# Patient Record
Sex: Male | Born: 1975 | Race: White | Hispanic: Yes | Marital: Single | Smoking: Current every day smoker
Health system: Southern US, Community
[De-identification: ages and names within clinical notes are randomized; demographics above are authoritative.]

## PROBLEM LIST (undated history)

## (undated) DIAGNOSIS — K760 Fatty (change of) liver, not elsewhere classified: Secondary | ICD-10-CM

## (undated) DIAGNOSIS — K859 Acute pancreatitis without necrosis or infection, unspecified: Secondary | ICD-10-CM

## (undated) HISTORY — DX: Fatty (change of) liver, not elsewhere classified: K76.0

## (undated) HISTORY — DX: Acute pancreatitis without necrosis or infection, unspecified: K85.90

---

## 1999-01-17 ENCOUNTER — Encounter: Payer: Self-pay | Admitting: Emergency Medicine

## 1999-01-17 ENCOUNTER — Emergency Department (HOSPITAL_COMMUNITY): Admission: EM | Admit: 1999-01-17 | Discharge: 1999-01-17 | Payer: Self-pay | Admitting: Emergency Medicine

## 1999-01-18 ENCOUNTER — Encounter: Payer: Self-pay | Admitting: Emergency Medicine

## 1999-01-24 ENCOUNTER — Emergency Department (HOSPITAL_COMMUNITY): Admission: EM | Admit: 1999-01-24 | Discharge: 1999-01-24 | Payer: Self-pay | Admitting: Emergency Medicine

## 2004-08-01 ENCOUNTER — Emergency Department (HOSPITAL_COMMUNITY): Admission: EM | Admit: 2004-08-01 | Discharge: 2004-08-01 | Payer: Self-pay | Admitting: Emergency Medicine

## 2007-11-24 ENCOUNTER — Emergency Department (HOSPITAL_COMMUNITY): Admission: EM | Admit: 2007-11-24 | Discharge: 2007-11-24 | Payer: Self-pay | Admitting: Emergency Medicine

## 2007-11-25 ENCOUNTER — Emergency Department (HOSPITAL_COMMUNITY): Admission: EM | Admit: 2007-11-25 | Discharge: 2007-11-25 | Payer: Self-pay | Admitting: Emergency Medicine

## 2010-06-24 ENCOUNTER — Inpatient Hospital Stay (HOSPITAL_COMMUNITY)
Admission: EM | Admit: 2010-06-24 | Discharge: 2010-06-29 | DRG: 440 | Disposition: A | Discharge status: Home / Self Care | Payer: Self-pay | Attending: Family Medicine | Admitting: Family Medicine

## 2010-06-24 ENCOUNTER — Emergency Department (HOSPITAL_COMMUNITY): Payer: Self-pay

## 2010-06-24 DIAGNOSIS — K859 Acute pancreatitis without necrosis or infection, unspecified: Secondary | ICD-10-CM

## 2010-06-24 DIAGNOSIS — E876 Hypokalemia: Secondary | ICD-10-CM | POA: Diagnosis present

## 2010-06-24 DIAGNOSIS — F1010 Alcohol abuse, uncomplicated: Secondary | ICD-10-CM | POA: Diagnosis present

## 2010-06-24 LAB — LACTATE DEHYDROGENASE: LDH: 181 U/L (ref 94–250)

## 2010-06-24 LAB — BASIC METABOLIC PANEL
GFR calc Af Amer: >60 mL/min (ref >60)
GFR calc non Af Amer: >60 mL/min (ref >60)
Potassium: 3.4 mEq/L — ABNORMAL LOW (ref 3.5–5.1)

## 2010-06-24 LAB — PROTIME-INR
INR: 0.97 (ref 0.00–1.49)
Prothrombin Time: 13.1 seconds (ref 11.6–15.2)

## 2010-06-24 LAB — CBC
Hemoglobin: 15.7 g/dL (ref 13.0–17.0)
MCHC: 35.4 g/dL (ref 30.0–36.0)
Platelets: 274 10*3/uL (ref 150–400)
Platelets: 242 10*3/uL (ref 150–400)

## 2010-06-24 LAB — COMPREHENSIVE METABOLIC PANEL
ALT: 73 U/L — ABNORMAL HIGH (ref 0–53)
Albumin: 4.3 g/dL (ref 3.5–5.2)
Alkaline Phosphatase: 67 U/L (ref 39–117)
Chloride: 99 mEq/L (ref 96–112)
Creatinine, Ser: 0.82 mg/dL (ref 0.4–1.5)
GFR calc non Af Amer: >60 mL/min (ref >60)
Glucose, Bld: 126 mg/dL — ABNORMAL HIGH (ref 70–99)
Potassium: 4 mEq/L (ref 3.5–5.1)
Total Bilirubin: 0.4 mg/dL (ref 0.3–1.2)
Total Protein: 8.3 g/dL (ref 6.0–8.3)

## 2010-06-24 LAB — DIFFERENTIAL
Eosinophils Absolute: 0 10*3/uL (ref 0.0–0.7)
Lymphs Abs: 1.3 10*3/uL (ref 0.7–4.0)
Monocytes Relative: 5 % (ref 3–12)

## 2010-06-24 MED ORDER — IOHEXOL 300 MG/ML  SOLN
80.0000 mL | Freq: Once | INTRAMUSCULAR | Status: AC | PRN
  Administered 2010-06-24: 80 mL via INTRAVENOUS

## 2010-06-25 LAB — COMPREHENSIVE METABOLIC PANEL
BUN: 4 mg/dL — ABNORMAL LOW (ref 6–23)
Calcium: 8.2 mg/dL — ABNORMAL LOW (ref 8.4–10.5)
Chloride: 97 mEq/L (ref 96–112)
Glucose, Bld: 97 mg/dL (ref 70–99)
Potassium: 3.1 mEq/L — ABNORMAL LOW (ref 3.5–5.1)
Total Bilirubin: 2.2 mg/dL — ABNORMAL HIGH (ref 0.3–1.2)
Total Protein: 7.9 g/dL (ref 6.0–8.3)

## 2010-06-25 LAB — CBC
HCT: 39.8 % (ref 39.0–52.0)
MCV: 91.7 fL (ref 78.0–100.0)
RBC: 4.34 MIL/uL (ref 4.22–5.81)
RDW: 12.5 % (ref 11.5–15.5)

## 2010-06-26 LAB — CBC
Hemoglobin: 12.4 g/dL — ABNORMAL LOW (ref 13.0–17.0)
MCH: 31.1 pg (ref 26.0–34.0)
MCV: 91.5 fL (ref 78.0–100.0)
RBC: 3.99 MIL/uL — ABNORMAL LOW (ref 4.22–5.81)
WBC: 16 10*3/uL — ABNORMAL HIGH (ref 4.0–10.5)

## 2010-06-26 LAB — GLUCOSE, CAPILLARY: Glucose-Capillary: 80 mg/dL (ref 70–99)

## 2010-06-26 LAB — COMPREHENSIVE METABOLIC PANEL
Alkaline Phosphatase: 57 U/L (ref 39–117)
CO2: 28 mEq/L (ref 19–32)
Chloride: 97 mEq/L (ref 96–112)
GFR calc Af Amer: >60 mL/min (ref >60)
GFR calc non Af Amer: >60 mL/min (ref >60)
Glucose, Bld: 73 mg/dL (ref 70–99)
Potassium: 3.3 mEq/L — ABNORMAL LOW (ref 3.5–5.1)
Total Protein: 6.9 g/dL (ref 6.0–8.3)

## 2010-06-27 LAB — COMPREHENSIVE METABOLIC PANEL
Alkaline Phosphatase: 60 U/L (ref 39–117)
BUN: 7 mg/dL (ref 6–23)
Calcium: 8.3 mg/dL — ABNORMAL LOW (ref 8.4–10.5)
Chloride: 96 mEq/L (ref 96–112)
Creatinine, Ser: 0.68 mg/dL (ref 0.4–1.5)
GFR calc Af Amer: >60 mL/min (ref >60)
Glucose, Bld: 69 mg/dL — ABNORMAL LOW (ref 70–99)
Total Protein: 6.9 g/dL (ref 6.0–8.3)

## 2010-06-27 LAB — CBC
MCH: 31.5 pg (ref 26.0–34.0)
MCV: 91.3 fL (ref 78.0–100.0)
Platelets: 232 10*3/uL (ref 150–400)
WBC: 12.8 10*3/uL — ABNORMAL HIGH (ref 4.0–10.5)

## 2010-06-28 LAB — COMPREHENSIVE METABOLIC PANEL
ALT: 55 U/L — ABNORMAL HIGH (ref 0–53)
AST: 92 U/L — ABNORMAL HIGH (ref 0–37)
Calcium: 8.3 mg/dL — ABNORMAL LOW (ref 8.4–10.5)
Creatinine, Ser: 0.62 mg/dL (ref 0.4–1.5)
Total Bilirubin: 0.7 mg/dL (ref 0.3–1.2)
Total Protein: 6.9 g/dL (ref 6.0–8.3)

## 2010-06-28 LAB — CBC
HCT: 34.7 % — ABNORMAL LOW (ref 39.0–52.0)
Hemoglobin: 12.1 g/dL — ABNORMAL LOW (ref 13.0–17.0)
MCH: 31.6 pg (ref 26.0–34.0)
MCHC: 34.9 g/dL (ref 30.0–36.0)
MCV: 90.6 fL (ref 78.0–100.0)
Platelets: 285 10*3/uL (ref 150–400)
RDW: 12.2 % (ref 11.5–15.5)

## 2010-06-29 LAB — BASIC METABOLIC PANEL
BUN: 9 mg/dL (ref 6–23)
CO2: 29 mEq/L (ref 19–32)
Chloride: 98 mEq/L (ref 96–112)
Glucose, Bld: 115 mg/dL — ABNORMAL HIGH (ref 70–99)
Potassium: 4.8 mEq/L (ref 3.5–5.1)
Sodium: 137 mEq/L (ref 135–145)

## 2010-06-29 NOTE — H&P (Signed)
[name], [name]           ACCOUNT NO.:  [id number]  MEDICAL RECORD NO.:  [id number]           PATIENT TYPE:  I  LOCATION:  2610                         FACILITY:  [facility]  PHYSICIAN:  Pearlean Brownie, M.D.DATE OF BIRTH:  04/19/1975  DATE OF ADMISSION:  06/24/2010 DATE OF DISCHARGE:                             HISTORY & PHYSICAL   PRIMARY CARE PROVIDER:  None.  This is an unassigned patient.  CHIEF COMPLAINT:  Abdominal pain.  HISTORY OF PRESENT ILLNESS:  [name] [name] is a 35 year old male with no past medical history, who presents with abdominal pain x1 day.  He woke up with this pain and it was very severe, constant, midepigastric being the worst, but generalized pain throughout, he calls it sharp and dull with  unrelenting pain.  Denies any type of fevers, chills, maybe some nausea.  Denies any vomiting, no diarrhea, no constipation.  He states he has had this one episode before while he was in [place], was given a shot and he was able to go home.  The patient denies any type of shortness of breath, chest pain, dysuria, polyuria, polydipsia either.  ALLERGIES:  No known drug allergies.  PAST MEDICAL HISTORY:  None.  PAST SURGICAL HISTORY:  None.  MEDICATIONS:  He takes Tylenol only as needed.  He has taken 2 yesterday.  SOCIAL HISTORY:  He lives alone, works as a Music therapist.  He does not smoke, but the patient does state that he drinks greater than 12 beers daily, more likely 15 if he was being honest, has not been without beer for sometime, he has never gone through withdrawals prior, and never had seizures.  No illicit drugs.  FAMILY HISTORY:  He has a brother with diabetes, otherwise unremarkable.  PERTINENT LABS:  The patient has a CBC done which shows a white blood cell count of 24.2, hemoglobin of 15.7, hematocrit of 44.4, and platelets of 274.  The patient has CMET done which shows a sodium of 138, potassium of 4.0, chloride of 99, bicarb of 22, BUN of  8, creatinine of 0.82, glucose of 126, AST of 51 and ALT of 73, total protein of 8.3, albumin of 4.3, and calcium of 9.2.  The patient also has a lipase of 368.  The patient has had an abdominal series done which shows mild diffuse gaseous distention which is likely a mild ileus and maybe some right lower lobe atelectasis.  The patient also had an abdominal ultrasound done that shows some fatty liver without any focal findings.  PHYSICAL EXAMINATION:  VITAL SIGNS:  The patient's temperature 97.9, pulse 115, respirations 22, blood pressure 152/89, and the patient is 99% on room air. GENERAL:  The patient is mildly distressed, very uncomfortable, is sweating. HEENT:  Pupils equal, round, reactive to light and accommodation.  The patient does have chalazion of the left eye. NECK:  No lymphadenopathy. HEART:  Tachy, regular rate and rhythm.  No murmur appreciated. LUNGS:  Bibasilar crackles, otherwise unremarkable.  Good air movement. No bowel sounds.  Mild-to-moderate distention, significant mostly in the epigastric region. EXTREMITIES:  No edema. NEUROVASCULAR:  Intact.  ASSESSMENT AND PLAN:  [name] [name] is  a 36 year old male admitted with of pancreatitis. 1. Pancreatitis.  The patient has an APACHE-II score of 5.0, his range     will be calculated later, he already has one point at this time     with the elevated white blood cell count.  We are worried with,     there is a chance this could be infectious versus necrotizing.  The     patient's past medical history is significant for alcohol which I     think this alcoholic pancreatitis as well.  Abdominal ultrasound     did not show any type of cholecystitis or any type of gallbladder     disease that could be causing this.  We will place the patient on     Primaxin.  We will do morphine as needed.  We will make the patient     n.p.o. and increase his IV fluids to 150 mL for sometime until the     patient looks more euvolemic and  is starting to feel a little     better.  We will monitor in the Step-Down Unit as of now.  We will     get a CT scan to rule out any type of necrotizing pancreatitis. 2. Alcohol abuse.  The patient appears to have a longstanding of that,     we will put him on CIWA protocol and monitor for any type of     withdrawal symptoms. 3. Fluids, electrolytes, nutrition and gastrointestinal, once again IV     fluids at 250.  The patient is n.p.o., we will not do an NG tube at     this time being that the patient appears to be comfortable.  If the     patient becomes in worse distress at that point we will do     decompression with an NG tube and also increase feeding. 4. Prophylaxis.  We will do SCDs, holding heparin now until I get the     patient's PT/INR, the patient's fatty liver to makes one concern     for the possibility of cirrhosis, especially with the patient's     drinking history and we will also do a PPI. 5. Disposition.  This will be pending further improvement.     [name] Primas, DO   ______________________________ Pearlean Brownie, M.D.    ZS/MEDQ  D:  06/24/2010  T:  06/25/2010  Job:  [id number]  Electronically Signed by [name] [name]  on 06/25/2010 11:47:33 AM Electronically Signed by Pearlean Brownie M.D. on 06/29/2010 03:46:01 PM

## 2010-07-12 NOTE — Discharge Summary (Signed)
[name] [name], [name] [name]           ACCOUNT NO.:  [id number]  MEDICAL RECORD NO.:  [id number]           PATIENT TYPE:  I  LOCATION:  3707                         FACILITY:  [facility]  PHYSICIAN:  [name] [name], M.D.DATE OF BIRTH:  Jul 30, 1975  DATE OF ADMISSION:  06/24/2010 DATE OF DISCHARGE:  06/29/2010                              DISCHARGE SUMMARY   PRIMARY CARE PROVIDER:  None.  DISCHARGE DIAGNOSES: 1. Alcoholic Pancreatitis, resolving. 2. Alcohol abuse. 3. Hypokalemia, resolved.  DISCHARGE MEDICATIONS:  None.  CONSULTS:  None.  PROCEDURES: 1. Abdominal ultrasound done on June 24, 2010, showing diffuse     hepatic steatosis without focal hepatic parenchymal abnormalities,     otherwise normal abdominal ultrasound with caveat that the     pancreatic head and tail in the mid distal abdominal aorta were     obscured by bowel gas, and therefore not evaluated. 2. CT pelvis on June 24, 2010, showing acute findings compatible with     acute pancreatitis involving the tail of the pancreas as well as     diffuse fatty infiltration of the liver and bibasilar atelectasis.  LABORATORY DATA:  On the day of admission, the patient's CBC showed a white count of 24.2 with 90% neutrophils and 5% lymphocytes.  On the day of discharge, white count had decreased to 10.0.  On day of admission, the patient's BMET was within normal limits with the exception of slightly elevated LFTs with AST 51 and ALT 73, creatinine was 0.82, lipase was elevated at 358, lipase decreased to 61 by the second day of admission and was no longer trended.  After rehydration, the patient's repeat BMET on the day of admission showed a potassium of 3.4, which decreased to 3.1 but on the day of discharge, has been improved to 4.8 in addition to the patient's magnesium was normal at 2.2.  BRIEF HOSPITAL COURSE:  This is a 35 year old male with history of alcohol abuse and pancreatitis, currently resolving. 1.  Pancreatitis.  Initial concern was for infection versus     inflammatory pancreatitis.  The patient initially was started on     Primaxin, which was stopped after 2 days and it was determined that     the pancreatitis was more likely to be due to inflammation from     alcohol abuse.  The patient was initially IV hydrated and made     n.p.o.  He was slowly weaned to clears, which he tolerated well and     on the day of discharge, he was tolerating a full diet.  Initially,     the patient was given morphine for pain control, however, this     worsened to pain likely secondary to sphincter of Oddi spasm.  He     was switched to IV Dilaudid, which controlled his pain.  Once he     was taking p.o.'s well, he was transitioned to [facility] and was     requiring minimal pain medications on the day of discharge. 2. Hypokalemia.  The patient was hyperkalemic on labs that were     checked.  He initially required IV potassium, which was changed  to     p.o. potassium once the patient was tolerating p.o.'s.  Magnesium    level was checked and was normal, however, IV magnesium was still     given x2 doses.  The patient's potassium had resolved well to 4.8     on the day of discharge. 3. Alcohol abuse.  On admission, the patient was started on CIWA     protocol.  The patient never had any Ativan requirement and never     showed any withdrawal symptoms.  The patient was amendable and     acknowledged the fact that he needed additional assistance with his     alcohol abuse problem and he agreed to inpatient rehab, which he     will be starting at [facility] on April 12.  DISCHARGE INSTRUCTIONS:  The patient was instructed to present to [facility] on April 12 in the morning to start his inpatient alcohol rehabilitation program.  DISCHARGE CONDITION:  The patient was discharged home in stable medical condition with the knowledge that he is to start inpatient alcohol rehab on July 08, 2010.    ______________________________ [name] Pore, MD   ______________________________ [name] Roach Grainger Mccarley, M.D.    JM/MEDQ  D:  06/29/2010  T:  06/30/2010  Job:  [id number]  Electronically Signed by [name] Pore MD on 07/05/2010 10:22:12 PM Electronically Signed by [name] [name] M.D. on 07/12/2010 09:34:12 AM

## 2010-11-12 ENCOUNTER — Emergency Department (HOSPITAL_COMMUNITY)
Admission: EM | Admit: 2010-11-12 | Discharge: 2010-11-13 | Disposition: A | Discharge status: Home / Self Care | Payer: Self-pay | Attending: Emergency Medicine | Admitting: Emergency Medicine

## 2010-11-12 DIAGNOSIS — I498 Other specified cardiac arrhythmias: Secondary | ICD-10-CM | POA: Insufficient documentation

## 2010-11-12 DIAGNOSIS — R002 Palpitations: Secondary | ICD-10-CM | POA: Insufficient documentation

## 2010-11-12 DIAGNOSIS — I4949 Other premature depolarization: Secondary | ICD-10-CM | POA: Insufficient documentation

## 2010-11-12 DIAGNOSIS — I4460 Unspecified fascicular block: Secondary | ICD-10-CM | POA: Insufficient documentation

## 2010-11-12 LAB — CBC
MCH: 31.6 pg (ref 26.0–34.0)
MCHC: 35.3 g/dL (ref 30.0–36.0)
MCV: 89.6 fL (ref 78.0–100.0)
Platelets: 293 10*3/uL (ref 150–400)
RBC: 4.71 MIL/uL (ref 4.22–5.81)

## 2010-11-12 LAB — URINALYSIS, ROUTINE W REFLEX MICROSCOPIC
Hgb urine dipstick: NEGATIVE
Nitrite: NEGATIVE
Specific Gravity, Urine: 1.017 (ref 1.005–1.030)
Urobilinogen, UA: 0.2 mg/dL (ref 0.0–1.0)
pH: 5.5 (ref 5.0–8.0)

## 2010-11-12 LAB — POCT I-STAT, CHEM 8
BUN: 10 mg/dL (ref 6–23)
Calcium, Ion: 1.04 mmol/L — ABNORMAL LOW (ref 1.12–1.32)
Chloride: 109 mEq/L (ref 96–112)
Creatinine, Ser: 1.1 mg/dL (ref 0.50–1.35)
Glucose, Bld: 123 mg/dL — ABNORMAL HIGH (ref 70–99)

## 2010-11-12 LAB — RAPID URINE DRUG SCREEN, HOSP PERFORMED
Cocaine: NOT DETECTED
Opiates: NOT DETECTED

## 2010-11-12 LAB — DIFFERENTIAL
Eosinophils Absolute: 0.3 10*3/uL (ref 0.0–0.7)
Lymphs Abs: 3.9 10*3/uL (ref 0.7–4.0)
Monocytes Absolute: 0.7 10*3/uL (ref 0.1–1.0)
Monocytes Relative: 7 % (ref 3–12)
Neutrophils Relative %: 44 % (ref 43–77)

## 2010-11-12 LAB — CK TOTAL AND CKMB (NOT AT ARMC)
CK, MB: 5.8 ng/mL — ABNORMAL HIGH (ref 0.3–4.0)
Relative Index: 1.7 (ref 0.0–2.5)

## 2010-12-01 ENCOUNTER — Encounter: Payer: Self-pay | Admitting: Physician Assistant

## 2010-12-02 ENCOUNTER — Encounter: Payer: Self-pay | Admitting: Physician Assistant

## 2010-12-04 ENCOUNTER — Emergency Department (HOSPITAL_COMMUNITY): Payer: Self-pay

## 2010-12-04 ENCOUNTER — Emergency Department (HOSPITAL_COMMUNITY)
Admission: EM | Admit: 2010-12-04 | Discharge: 2010-12-04 | Disposition: A | Discharge status: Home / Self Care | Payer: Self-pay | Attending: Emergency Medicine | Admitting: Emergency Medicine

## 2010-12-04 DIAGNOSIS — R1013 Epigastric pain: Secondary | ICD-10-CM | POA: Insufficient documentation

## 2010-12-04 DIAGNOSIS — F1010 Alcohol abuse, uncomplicated: Secondary | ICD-10-CM | POA: Insufficient documentation

## 2010-12-04 DIAGNOSIS — S301XXA Contusion of abdominal wall, initial encounter: Secondary | ICD-10-CM | POA: Insufficient documentation

## 2010-12-04 LAB — COMPREHENSIVE METABOLIC PANEL
ALT: 264 U/L — ABNORMAL HIGH (ref 0–53)
CO2: 26 mEq/L (ref 19–32)
Calcium: 8.7 mg/dL (ref 8.4–10.5)
GFR calc Af Amer: >60 mL/min (ref >60)
GFR calc non Af Amer: >60 mL/min (ref >60)
Glucose, Bld: 104 mg/dL — ABNORMAL HIGH (ref 70–99)
Sodium: 131 mEq/L — ABNORMAL LOW (ref 135–145)

## 2010-12-04 LAB — DIFFERENTIAL
Eosinophils Relative: 2 % (ref 0–5)
Lymphocytes Relative: 38 % (ref 12–46)
Lymphs Abs: 2.7 10*3/uL (ref 0.7–4.0)
Monocytes Absolute: 0.7 10*3/uL (ref 0.1–1.0)

## 2010-12-04 LAB — URINALYSIS, ROUTINE W REFLEX MICROSCOPIC
Bilirubin Urine: NEGATIVE
Protein, ur: NEGATIVE mg/dL
Urobilinogen, UA: 0.2 mg/dL (ref 0.0–1.0)

## 2010-12-04 LAB — CBC
HCT: 39.9 % (ref 39.0–52.0)
MCH: 32.3 pg (ref 26.0–34.0)
MCHC: 36.3 g/dL — ABNORMAL HIGH (ref 30.0–36.0)
MCV: 88.9 fL (ref 78.0–100.0)
RDW: 12.6 % (ref 11.5–15.5)

## 2010-12-04 MED ORDER — IOHEXOL 300 MG/ML  SOLN
90.0000 mL | Freq: Once | INTRAMUSCULAR | Status: AC | PRN
  Administered 2010-12-04: 90 mL via INTRAVENOUS

## 2010-12-14 ENCOUNTER — Emergency Department (HOSPITAL_COMMUNITY)
Admission: EM | Admit: 2010-12-14 | Discharge: 2010-12-14 | Disposition: A | Discharge status: Home / Self Care | Payer: Self-pay | Attending: Emergency Medicine | Admitting: Emergency Medicine

## 2010-12-14 ENCOUNTER — Emergency Department (HOSPITAL_COMMUNITY): Payer: Self-pay

## 2010-12-14 DIAGNOSIS — I498 Other specified cardiac arrhythmias: Secondary | ICD-10-CM | POA: Insufficient documentation

## 2010-12-14 DIAGNOSIS — F1410 Cocaine abuse, uncomplicated: Secondary | ICD-10-CM | POA: Insufficient documentation

## 2010-12-14 DIAGNOSIS — R079 Chest pain, unspecified: Secondary | ICD-10-CM | POA: Insufficient documentation

## 2010-12-14 DIAGNOSIS — R0602 Shortness of breath: Secondary | ICD-10-CM | POA: Insufficient documentation

## 2010-12-14 LAB — DIFFERENTIAL
Basophils Absolute: 0.1 10*3/uL (ref 0.0–0.1)
Basophils Absolute: 0.1 10*3/uL (ref 0.0–0.1)
Basophils Relative: 1 % (ref 0–1)
Basophils Relative: 1 % (ref 0–1)
Eosinophils Absolute: 0.1 10*3/uL (ref 0.0–0.7)
Eosinophils Absolute: 0.1 10*3/uL (ref 0.0–0.7)
Eosinophils Relative: 1 % (ref 0–5)
Eosinophils Relative: 1 % (ref 0–5)
Lymphocytes Relative: 38 % (ref 12–46)
Monocytes Absolute: 0.9 10*3/uL (ref 0.1–1.0)
Monocytes Absolute: 0.9 10*3/uL (ref 0.1–1.0)
Neutro Abs: 6.1 10*3/uL (ref 1.7–7.7)

## 2010-12-14 LAB — URINALYSIS, ROUTINE W REFLEX MICROSCOPIC
Leukocytes, UA: NEGATIVE
Protein, ur: NEGATIVE mg/dL
Specific Gravity, Urine: 1.013 (ref 1.005–1.030)
Urobilinogen, UA: 0.2 mg/dL (ref 0.0–1.0)

## 2010-12-14 LAB — CBC
HCT: 43.4 % (ref 39.0–52.0)
MCHC: 35.7 g/dL (ref 30.0–36.0)
MCHC: 34.3 g/dL (ref 30.0–36.0)
Platelets: 319 10*3/uL (ref 150–400)
Platelets: 293 10*3/uL (ref 150–400)
RDW: 13.2 % (ref 11.5–15.5)
RDW: 13.2 % (ref 11.5–15.5)
WBC: 10.9 10*3/uL — ABNORMAL HIGH (ref 4.0–10.5)

## 2010-12-14 LAB — POCT I-STAT TROPONIN I
Troponin i, poc: 0 ng/mL (ref 0.00–0.08)
Troponin i, poc: 0 ng/mL (ref 0.00–0.08)

## 2010-12-14 LAB — POCT I-STAT, CHEM 8
Calcium, Ion: 1.11 mmol/L — ABNORMAL LOW (ref 1.12–1.32)
Creatinine, Ser: 1 mg/dL (ref 0.50–1.35)
Glucose, Bld: 122 mg/dL — ABNORMAL HIGH (ref 70–99)
Hemoglobin: 17 g/dL (ref 13.0–17.0)
Sodium: 142 mEq/L (ref 135–145)
TCO2: 24 mmol/L (ref 0–100)

## 2010-12-14 LAB — RAPID URINE DRUG SCREEN, HOSP PERFORMED
Cocaine: POSITIVE — AB
Opiates: NOT DETECTED

## 2010-12-14 LAB — COMPREHENSIVE METABOLIC PANEL
AST: 73 U/L — ABNORMAL HIGH (ref 0–37)
CO2: 25 mEq/L (ref 19–32)
Calcium: 9.2 mg/dL (ref 8.4–10.5)
Creatinine, Ser: 0.73 mg/dL (ref 0.50–1.35)
GFR calc Af Amer: >60 mL/min (ref >60)
GFR calc non Af Amer: >60 mL/min (ref >60)
Glucose, Bld: 119 mg/dL — ABNORMAL HIGH (ref 70–99)
Total Protein: 8.4 g/dL — ABNORMAL HIGH (ref 6.0–8.3)

## 2010-12-14 LAB — PROTIME-INR: INR: 0.9 (ref 0.00–1.49)

## 2010-12-15 LAB — TSH: TSH: 1.963 u[IU]/mL (ref 0.350–4.500)

## 2010-12-16 ENCOUNTER — Encounter: Payer: Self-pay | Admitting: Internal Medicine

## 2013-05-01 IMAGING — CR DG CHEST 1V PORT
1 series · 1 of 1 positions shown · non-contrast
Comparison: 06/24/2010

CLINICAL DATA: 35-year-old male with chest pain

PORTABLE CHEST - 1 VIEW

[view not recorded]
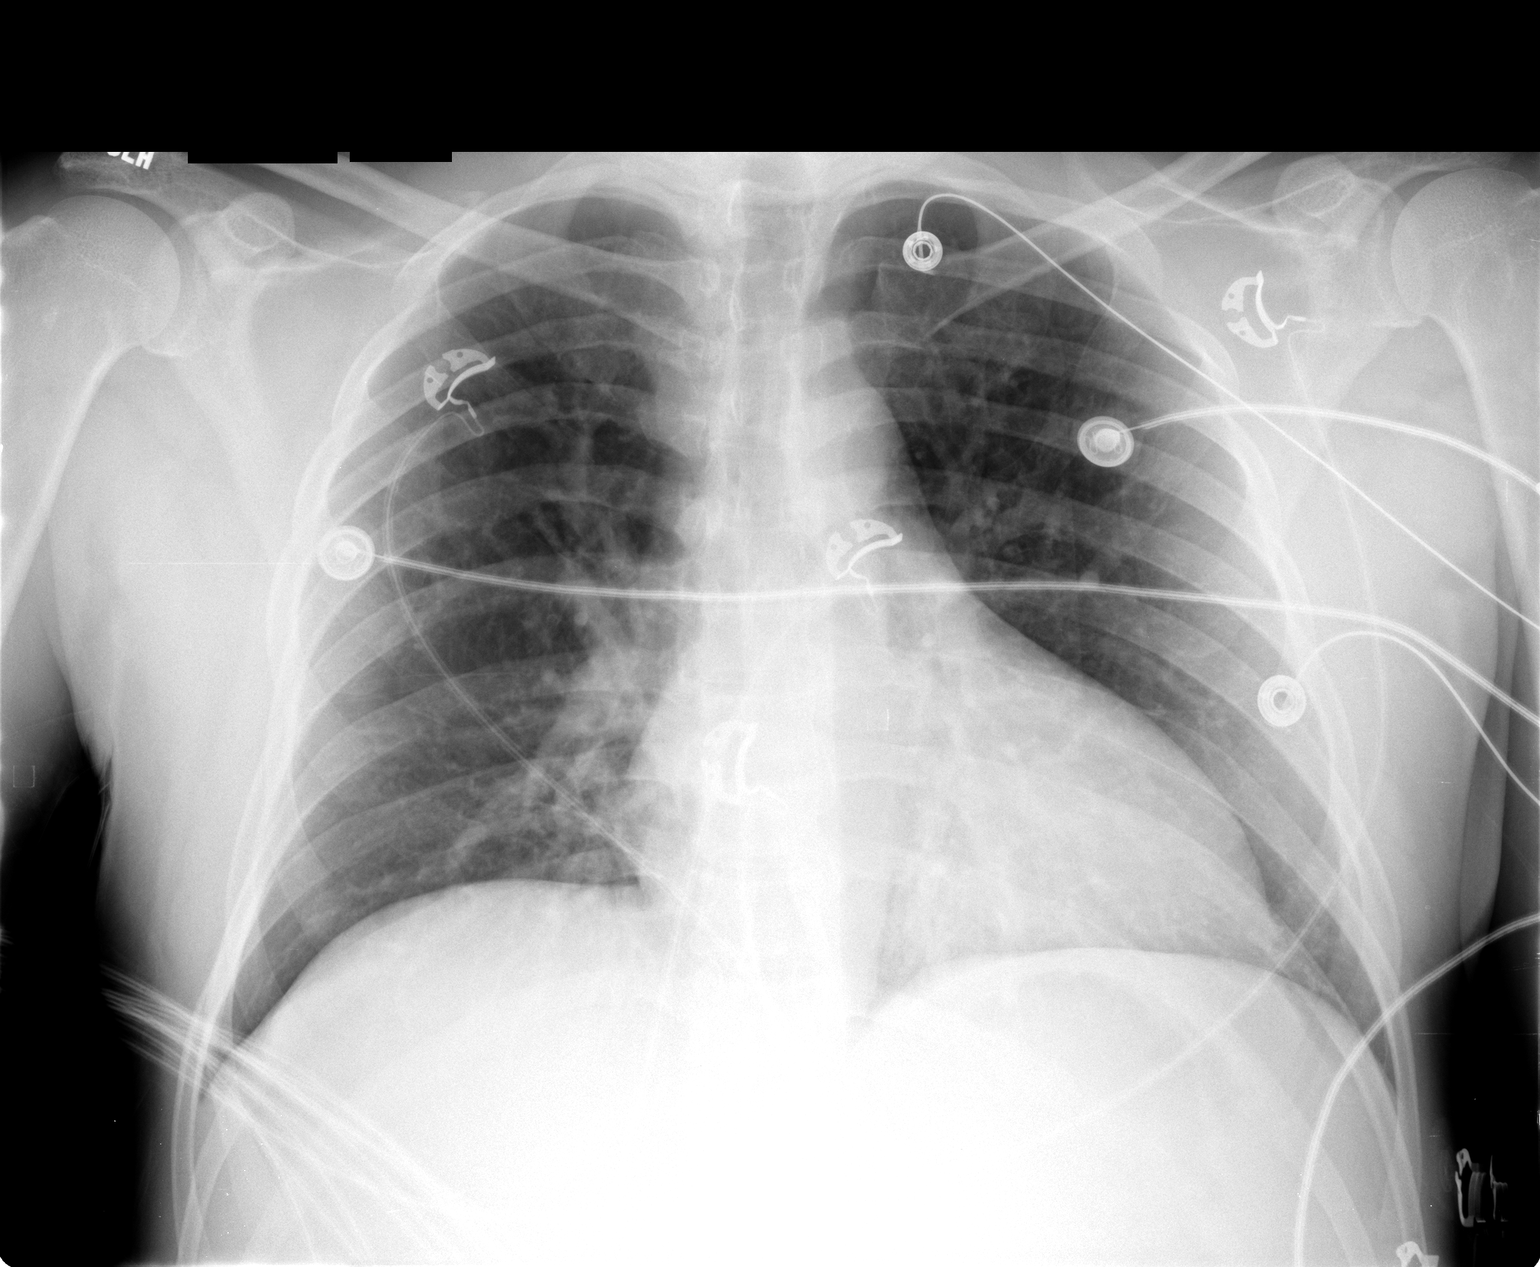

[1 of 1 positions shown; findings below may reference images not displayed]

FINDINGS: The cardiomediastinal silhouette is unremarkable.
There is no evidence of focal airspace disease, pulmonary edema,
pulmonary nodule/mass, pleural effusion, or pneumothorax.
No acute bony abnormalities are identified.
IMPRESSION: No evidence of active cardiopulmonary disease.

## 2013-11-25 NOTE — Care Management Note (Signed)
    Page 1 of 1   11/23/2013     10:44:11 AM CARE MANAGEMENT NOTE 11/23/2013  Patient:  EAVEN, SCHWAGER   Account Number:  [id number]  Date Initiated:  11/23/2013  Documentation initiated by:  [facility]  Subjective/Objective Assessment:   adm:  "not feeling well"     Action/Plan:   discharge planning   Anticipated DC Date:  11/22/2013   Anticipated DC Plan:  HOME W HOME HEALTH SERVICES      DC Planning Services  CM consult      Choice offered to / List presented to:             Status of service:  Completed, signed off Medicare Important Message given?   (If response is "NO", the following Medicare IM given date fields will be blank) Date Medicare IM given:   Medicare IM given by:   Date Additional Medicare IM given:   Additional Medicare IM given by:    Discharge Disposition:  HOME W HOME HEALTH SERVICES  Per UR Regulation:    If discussed at Long Length of Stay Meetings, dates discussed:    Comments:  11/23/13 10:25 Cm received a call from [facility] rep, Judeth Cornfield to request orders/F2F be placed for pt who was discharged and set up with [facility] yesterday 11/22/13 for HHPT and rolling walker.  CM requested order/F2F from Md.  No other CM needs were communicated.  Freddy Jaksch, BSN, CM [phone].

## 2018-12-09 ENCOUNTER — Emergency Department (HOSPITAL_COMMUNITY): Payer: Self-pay

## 2018-12-09 ENCOUNTER — Other Ambulatory Visit: Payer: Self-pay

## 2018-12-09 ENCOUNTER — Emergency Department (HOSPITAL_COMMUNITY)
Admission: EM | Admit: 2018-12-09 | Discharge: 2018-12-09 | Disposition: A | Discharge status: Home / Self Care | Payer: Self-pay | Attending: Emergency Medicine | Admitting: Emergency Medicine

## 2018-12-09 ENCOUNTER — Encounter (HOSPITAL_COMMUNITY): Payer: Self-pay | Admitting: Emergency Medicine

## 2018-12-09 DIAGNOSIS — M545 Low back pain: Secondary | ICD-10-CM | POA: Insufficient documentation

## 2018-12-09 DIAGNOSIS — F10929 Alcohol use, unspecified with intoxication, unspecified: Secondary | ICD-10-CM | POA: Insufficient documentation

## 2018-12-09 DIAGNOSIS — Z87898 Personal history of other specified conditions: Secondary | ICD-10-CM

## 2018-12-09 DIAGNOSIS — M79605 Pain in left leg: Secondary | ICD-10-CM | POA: Insufficient documentation

## 2018-12-09 DIAGNOSIS — F17210 Nicotine dependence, cigarettes, uncomplicated: Secondary | ICD-10-CM | POA: Insufficient documentation

## 2018-12-09 LAB — CBG MONITORING, ED: Glucose-Capillary: 97 mg/dL (ref 70–99)

## 2018-12-09 MED ORDER — PREDNISONE 10 MG PO TABS: 40.0000 mg | ORAL_TABLET | Freq: Every day | ORAL | 0 refills | Status: AC

## 2018-12-09 MED ORDER — METHOCARBAMOL 500 MG PO TABS: 500.0000 mg | ORAL_TABLET | Freq: Three times a day (TID) | ORAL | 0 refills | Status: AC

## 2018-12-09 MED ORDER — ACETAMINOPHEN 500 MG PO TABS
1000.0000 mg | ORAL_TABLET | Freq: Once | ORAL | Status: AC
  Administered 2018-12-09: 1000 mg via ORAL
  Filled 2018-12-09: qty 2

## 2018-12-09 NOTE — ED Provider Notes (Signed)
[facility] EMERGENCY DEPARTMENT Provider Note   CSN: [id number] Arrival date & time: 12/09/18  1449     History   Chief Complaint Chief Complaint  Patient presents with  . Leg Pain    HPI [name] [name] is a 43 y.o. male with h/o alcohol use disorder, cocaine use presents to ER for evaluation of left buttock pain described as sharp that began this morning when he woke up at 0400.  The pain radiates down the left posterior leg to the foot. The pain is present with weight bearing only, bending down.  He has associated tingling "ants" sensation in all of his left toes worst in the great toe.  Denies loss of sensation, states when he touches his toes he feels tingling.  No weakness or heaviness to the leg but states it hurts to lift it and put weight on it.  He denies any falls or back trauma. He denies any fever, chills.  No saddle anesthesia, loss of bladder or bowel control or retention.  He took a pill his friend gave him prior to arrival but doesn't know the name.  Admits to drinking at least 12 beers daily and snorting cocaine daily.  He has drank 10 beers and snorted cocaine today.  He denies falls. He isn't sure if he slept in an odd position last night.  No h/o diabetes.      HPI  Past Medical History:  Diagnosis Date  . Abdominal pain   . Fatty liver   . Pancreatitis     There are no active problems to display for this patient.   History reviewed. No pertinent surgical history.      Home Medications    Prior to Admission medications   Medication Sig Start Date End Date Taking? Authorizing Provider  methocarbamol (ROBAXIN) 500 MG tablet Take 1 tablet (500 mg total) by mouth 3 (three) times daily for 5 days. 12/09/18 12/14/18  Kinnie Feil, PA-C  predniSONE (DELTASONE) 10 MG tablet Take 4 tablets (40 mg total) by mouth daily for 5 days. 12/09/18 12/14/18  Kinnie Feil, PA-C    Family History Family History  Problem Relation Age of  Onset  . Diabetes Brother     Social History Social History   Tobacco Use  . Smoking status: Current Every Day Smoker  . Smokeless tobacco: Never Used  Substance Use Topics  . Alcohol use: Yes    Alcohol/week: 12.0 standard drinks    Types: 12 Cans of beer per week    Comment: 12 BEERS DAILY  . Drug use: No     Allergies   Patient has no known allergies.   Review of Systems Review of Systems  Musculoskeletal: Positive for arthralgias, gait problem and myalgias.  Neurological:       Tingling   All other systems reviewed and are negative.    Physical Exam Updated Vital Signs BP 128/86 (BP Location: Right Arm)   Pulse 100   Temp 99.3 F (37.4 C) (Oral)   Resp 17   SpO2 96%   Physical Exam Constitutional:      General: He is not in acute distress.    Appearance: He is well-developed.  HENT:     Head: Normocephalic and atraumatic.     Nose: Nose normal.  Cardiovascular:     Rate and Rhythm: Normal rate.     Pulses:          Radial pulses are 2+ on the right  side and 2+ on the left side.       Dorsalis pedis pulses are 2+ on the right side and 2+ on the left side.     Heart sounds: Normal heart sounds.  Pulmonary:     Effort: Pulmonary effort is normal.     Breath sounds: Normal breath sounds.  Abdominal:     Palpations: Abdomen is soft.     Tenderness: There is no abdominal tenderness.     Comments: No suprapubic or CVA tenderness   Musculoskeletal:        General: Tenderness present.     Lumbar back: He exhibits tenderness and pain.     Comments: T-spine: no midline or paraspinal tenderness L-spine: no midline or paraspinal tenderness.  No SI joint bilaterally. No RIGHT sciatic notch tenderness. +TTP to LEFT sciatic notch (pt jumped up with palpation).  Positive LEFT SLR (pain in left buttock but no tingling). Negative RIGHT SLR. Negative contralateral SLR. Negative Faber bilaterally.   Pelvis: Mild pain in left buttock with left hip IR/ER, but no  crepitus.  No AP/L instability noted with compression. No leg shortening or rotation.   Patient able to get out of bed, ambulate 5+ steps in ER with mild antalgic gait (left) but no foot drag.   Skin:    General: Skin is warm and dry.     Capillary Refill: Capillary refill takes less than 2 seconds.     Comments: No overlaying rash to back   Neurological:     Mental Status: He is alert.     Sensory: No sensory deficit.     Comments: Can lift and hold legs without unilateral weakness or drift 5/5 strength with flexion/extension of hip, knee and ankle, bilaterally.  Sensation to light touch intact in lower extremities including feet, "tingling" reported with palpation of all left toes. Sensation and strength intact in upper extremities. Alert and oriented to self, place, time and event.  Speech is fluent without dysarthria or dysphasia. Normal gait without ataxia No pronator drift. No leg drop. Normal heel to shin without dysmetric, unilateral weakness.   CN I not tested CN II grossly intact visual fields bilaterally. Unable to visualize posterior eye. CN III, IV, VI PEERL and EOMs intact bilaterally CN V light touch intact in all 3 divisions of trigeminal nerve CN VII facial movements symmetric CN VIII hearing is intact to voice/conversation CN IX, X no uvula deviation, symmetric rise of soft palate  CN XI 5/5 SCM and trapezius strength bilaterally  CN XII Midline tongue protrusion, symmetric L/R movements  Psychiatric:        Behavior: Behavior normal.        Thought Content: Thought content normal.      ED Treatments / Results  Labs (all labs ordered are listed, but only abnormal results are displayed) Labs Reviewed  CBG MONITORING, ED    EKG None  Radiology Dg Lumbar Spine Complete  Result Date: 12/09/2018 CLINICAL DATA:  Posterior left hip pain with left foot tingling. EXAM: LUMBAR SPINE - COMPLETE 4+ VIEW COMPARISON:  None. FINDINGS: Vertebral body alignment and  heights are normal. There is mild spondylosis of the lumbar spine to include mild facet arthropathy over the lower lumbar spine. There is minimal disc space narrowing at the L3-4 level. No evidence of compression fracture or spondylolisthesis. IMPRESSION: No acute findings. Mild spondylosis of the lumbar spine with minimal disc disease at the L3-4 level. Electronically Signed   By: Elberta Fortisaniel  Boyle M.D.  On: 12/09/2018 16:27   Dg Hip Unilat W Or Wo Pelvis 2-3 Views Left  Result Date: 12/09/2018 CLINICAL DATA:  Left posterior hip pain with left foot tingling. No injury. EXAM: DG HIP (WITH OR WITHOUT PELVIS) 2-3V LEFT COMPARISON:  None. FINDINGS: No evidence of acute fracture or dislocation. Minimal degenerative change of the spine. Remaining bones and soft tissues are unremarkable. IMPRESSION: No acute findings. Electronically Signed   By: Elberta Fortis M.D.   On: 12/09/2018 16:28    Procedures Procedures (including critical care time)  Medications Ordered in ED Medications  acetaminophen (TYLENOL) tablet 1,000 mg (1,000 mg Oral Given 12/09/18 1642)     Initial Impression / Assessment and Plan / ED Course  I have reviewed the triage vital signs and the nursing notes.  Pertinent labs & imaging results that were available during my care of the patient were reviewed by me and considered in my medical decision making (see chart for details).  Clinical Course as of Dec 08 1924  Wynelle Link Dec 09, 2018  1648 Mild spondylosis of the lumbar spine with minimal disc disease at the L3-4 level.    DG Lumbar Spine Complete [CG]    Clinical Course User Index [CG] Liberty Handy, PA-C   Highest on ddx include muscular strain or spasms, disc bulging with nerve inflammation, radicular etiology.  He reports significant ETOH use, daily and drinking 10 beers today however he appears clinically sober. He has clear speech, steady gait and provides good history.  He denies fall, trauma, but given his history of  ETOH use, occult fractured considered but given exam this was less likely.  Low threshold for imaging given his ETOH use and likely mild ETOH intoxication here.  X-rays of lumbar and hip obtained and reviewed by me. These were negative for acute fracture but shows DDD at level 3-4.    I also considered CVA given his initial report of "numbness" at triage.  However upon further questioning patient reports this is more of a tingling sensation and only on the distribution of his left toes worse in the toe.  He has no neuro deficits on my exam.  He is neurovascularly intact.  Steady gait without foot drag.  Given this I considered CVA highly unlikely.  He has no signs of cauda equina.  No fever, chills to suggest infectious process.  He has no CVA tenderness, history of kidney stones and I doubt pyelonephritis.  No abdominal tenderness or pulsatility to suggest AAA.  Will DC with management for likely MSK/soft tissue etiology, sciatica/radicular etiology.  Will DC with Tylenol, Robaxin, prednisone.  Recommended follow-up closely with [facility] for reevaluation.  Strict return precautions discussed with patient who verbalized understanding.  Final Clinical Impressions(s) / ED Diagnoses   Final diagnoses:  Left leg pain  History of alcohol use disorder    ED Discharge Orders         Ordered    predniSONE (DELTASONE) 10 MG tablet  Daily     12/09/18 1703    methocarbamol (ROBAXIN) 500 MG tablet  3 times daily     12/09/18 1703           Jerrell Mylar 12/09/18 1926    Gwyneth Sprout, MD 12/09/18 2042

## 2018-12-09 NOTE — ED Triage Notes (Signed)
CBG 97 

## 2018-12-09 NOTE — ED Notes (Signed)
Patient transported to x-ray. ?

## 2018-12-09 NOTE — ED Triage Notes (Addendum)
Pt with left lower extremity burning "in my bone". Reports left buttocks radiating down since 0400 that woke him out of his sleep. Drinks alcohol and cigarettes daily with last drink 3 hrs prior. Smells of ETOH. "I took a lot of medication today someone sold it to me they're prescribed medication." Pain 1/10. Interpreter used.

## 2018-12-09 NOTE — Discharge Instructions (Addendum)
You came to the ER for pain in left buttock, leg and tingling in left toes  X-rays of lumbar spine and left hip shows arthritis but everything else was normal.   I think your symptoms are from inflammation of your sciatic nerve or muscle spasms.  Take 1000 mg aceteaminophen (tylenol) every 6 hours for pain.  Take robaxin 500 mg every 8 hours for muscle spasm and tightness.  take prednisone 40 mg once a day for 5 days to help with inflammation of nerve.  Stretch and massage.   Symptoms should improve in 5-7 days, sometimes it can take a little bit longer.   Return for sudden weakness or heaviness, loss of sensation to one side of your body, severe back or abdominal pain, numbness in your groin or loss of control of bowel or bladder   Lleg a la sala de emergencias por dolor en la nalga izquierda, pierna y hormigueo en los dedos del pie izquierdo  Las radiografas de columna lumbar y cadera izquierda muestran artritis pero todo lo dems era normal.  Creo que sus sntomas se deben a la inflamacin del nervio citico o espasmos musculares.  Tome 1000 mg de aceteaminofeno (tylenol) cada 6 horas para el dolor. Tome robaxin 500 mg cada 8 horas para el espasmo muscular y la tensin. tome prednisona 40 mg Dollene Cleveland al da durante 5 das para ayudar con la inflamacin del nervio. Estrate y [place].  Los sntomas Actor en 5-7 das, a veces puede llevar un poco ms de [place].  Retorno por debilidad o pesadez repentina, prdida de la sensibilidad en un lado del cuerpo, dolor severo de espalda o abdominal, entumecimiento en la ingle o prdida del control de los intestinos o la vejiga

## 2018-12-09 NOTE — ED Notes (Signed)
Patient verbalizes understanding of discharge instructions . Opportunity for questions and answers were provided . Armband removed by staff ,Pt discharged from ED. W/C  offered at D/C  and Declined W/C at D/C and was escorted to lobby by RN.  

## 2020-09-13 ENCOUNTER — Emergency Department (HOSPITAL_COMMUNITY)
Admission: EM | Admit: 2020-09-13 | Discharge: 2020-09-13 | Disposition: A | Discharge status: Home / Self Care | Payer: Self-pay | Attending: Emergency Medicine | Admitting: Emergency Medicine

## 2020-09-13 ENCOUNTER — Other Ambulatory Visit: Payer: Self-pay

## 2020-09-13 ENCOUNTER — Encounter (HOSPITAL_COMMUNITY): Payer: Self-pay

## 2020-09-13 DIAGNOSIS — T7840XA Allergy, unspecified, initial encounter: Secondary | ICD-10-CM | POA: Insufficient documentation

## 2020-09-13 DIAGNOSIS — F17200 Nicotine dependence, unspecified, uncomplicated: Secondary | ICD-10-CM | POA: Insufficient documentation

## 2020-09-13 DIAGNOSIS — L500 Allergic urticaria: Secondary | ICD-10-CM | POA: Insufficient documentation

## 2020-09-13 MED ORDER — DIPHENHYDRAMINE HCL 50 MG/ML IJ SOLN
50.0000 mg | Freq: Once | INTRAMUSCULAR | Status: AC
  Administered 2020-09-13: 50 mg via INTRAVENOUS
  Filled 2020-09-13: qty 1

## 2020-09-13 MED ORDER — FAMOTIDINE IN NACL 20-0.9 MG/50ML-% IV SOLN
20.0000 mg | Freq: Once | INTRAVENOUS | Status: AC
  Administered 2020-09-13: 20 mg via INTRAVENOUS
  Filled 2020-09-13: qty 50

## 2020-09-13 MED ORDER — METHYLPREDNISOLONE SODIUM SUCC 125 MG IJ SOLR
125.0000 mg | Freq: Once | INTRAMUSCULAR | Status: AC
  Administered 2020-09-13: 125 mg via INTRAVENOUS
  Filled 2020-09-13: qty 2

## 2020-09-13 MED ORDER — PREDNISONE 10 MG PO TABS: 20.0000 mg | ORAL_TABLET | Freq: Every day | ORAL | 0 refills | Status: AC

## 2020-09-13 MED ORDER — FAMOTIDINE 40 MG PO TABS: 40.0000 mg | ORAL_TABLET | Freq: Every day | ORAL | 0 refills | Status: DC

## 2020-09-13 MED ORDER — DIPHENHYDRAMINE HCL 50 MG/ML IJ SOLN: 25.0000 mg | Freq: Once | INTRAMUSCULAR | Status: DC

## 2020-09-13 MED ORDER — DIPHENHYDRAMINE HCL 25 MG PO TABS: 25.0000 mg | ORAL_TABLET | Freq: Four times a day (QID) | ORAL | 0 refills | Status: DC | PRN

## 2020-09-13 MED ORDER — FAMOTIDINE IN NACL 20-0.9 MG/50ML-% IV SOLN
20.0000 mg | Freq: Once | INTRAVENOUS | Status: DC
Start: 2020-09-13 — End: 2020-09-13

## 2020-09-13 NOTE — Discharge Instructions (Addendum)
-  You received medications in the ER for an allergic reaction.  Prescriptions were sent to your pharmacy that you should take over the next several days.  You can take Benadryl starting today, your next dose will be around 4 PM today.  This is to help with itching.  The other medications Pepcid and prednisone you can start taking them tomorrow.  Take as prescribed.  Return to the ER if you have any new or worsening symptoms.  _____  [name] [name] recibi medicamentos en la sala de emergencias por una reaccin alrgica.  Se enviaron recetas a su farmacia que debe tomar [employer].  Puede tomar Benadryl a partir de hoy, su prxima dosis ser alrededor de las 4 p. m. de hoy. Esto es para ayudar con la picazn.  Los otros [employer] Pepcid y prednisona los puedes empezar a Chief Operating Officer. Tome segn lo prescrito.  Regrese a la sala de emergencias si tiene sntomas nuevos o que empeoran.

## 2020-09-13 NOTE — ED Triage Notes (Signed)
Pt here from home with complaints of itching all over that started yesterday evening around 6pm. Denies eating or doing anything different from his norm. Denies shob and pain. No swelling around mouth and tongue. VSS  Spanish interpreter used for triage.

## 2020-09-13 NOTE — ED Provider Notes (Signed)
[facility] EMERGENCY DEPARTMENT Provider Note   CSN: [id number] Arrival date & time: 09/13/20  0830     History Chief Complaint  Patient presents with   Allergic Reaction     [name] [name] is a 45 y.o. male with past medical history significant for fatty liver and pancreatitis.  HPI Patient presents to emergency room today with chief complaint of itching x1 day.  Patient states around 6 PM last night he suddenly developed full body itching.  Patient admits this started after snorting cocaine.  This was not his first time using drugs and he also admits to drinking daily.  Yesterday he consumed least 7 beers.  Denies any history of allergic reactions in the past.  He states he was outside for most of the day yesterday however does not think he was bit by an insect or stung by an insect.  He has not taken any medications for symptoms prior to arrival.  He states he had a hard time sleeping because he was itching so much.  He denies any difficulty breathing, wheezing, facial swelling, nausea or emesis.  Also denies any changes in his lotions, detergents.  He has not been in contact with anyone with similar symptoms.  Due to language barrier, a video interpreter was present during the history-taking and subsequent discussion (and for part of the physical exam) with this patient.   Past Medical History:  Diagnosis Date   Abdominal pain    Fatty liver    Pancreatitis     There are no problems to display for this patient.   No past surgical history on file.     Family History  Problem Relation Age of Onset   Diabetes Brother     Social History   Tobacco Use   Smoking status: Every Day    Pack years: 0.00   Smokeless tobacco: Never  Substance Use Topics   Alcohol use: Yes    Alcohol/week: 12.0 standard drinks    Types: 12 Cans of beer per week    Comment: 12 BEERS DAILY   Drug use: No    Home Medications Prior to Admission medications    Medication Sig Start Date End Date Taking? Authorizing Provider  diphenhydrAMINE (BENADRYL) 25 MG tablet Take 1 tablet (25 mg total) by mouth every 6 (six) hours as needed for up to 5 days. 09/13/20 09/18/20 Yes Walisiewicz, Timmya Blazier E, PA-C  famotidine (PEPCID) 40 MG tablet Take 1 tablet (40 mg total) by mouth daily for 5 days. 09/14/20 09/19/20 Yes Walisiewicz, Lynnzie Blackson E, PA-C  predniSONE (DELTASONE) 10 MG tablet Take 2 tablets (20 mg total) by mouth daily for 5 days. 09/14/20 09/19/20 Yes Shanon Ace, PA-C    Allergies    Patient has no known allergies.  Review of Systems   Review of Systems All other systems are reviewed and are negative for acute change except as noted in the HPI.  Physical Exam Updated Vital Signs BP (!) 139/97 (BP Location: Left Arm)   Pulse 76   Temp 97.6 F (36.4 C) (Oral)   Resp 17   SpO2 100%   Physical Exam Vitals and nursing note reviewed.  Constitutional:      General: He is not in acute distress.    Appearance: He is not ill-appearing.     Comments: Patient itching at extremities during entirety of exam.  Airway is patent.  HENT:     Head: Normocephalic and atraumatic.     Comments: No  angioedema, no oral swelling.      Right Ear: Tympanic membrane and external ear normal.     Left Ear: Tympanic membrane and external ear normal.     Nose: Nose normal.     Mouth/Throat:     Mouth: Mucous membranes are moist.     Pharynx: Oropharynx is clear. Uvula midline. No pharyngeal swelling or uvula swelling.  Eyes:     General: No scleral icterus.       Right eye: No discharge.        Left eye: No discharge.     Extraocular Movements: Extraocular movements intact.     Conjunctiva/sclera: Conjunctivae normal.     Pupils: Pupils are equal, round, and reactive to light.  Neck:     Vascular: No JVD.  Cardiovascular:     Rate and Rhythm: Normal rate and regular rhythm.     Pulses: Normal pulses.          Radial pulses are 2+ on the right side and  2+ on the left side.     Heart sounds: Normal heart sounds.  Pulmonary:     Comments: Lungs clear to auscultation in all fields. Symmetric chest rise. No wheezing, rales, or rhonchi. Patient talking in full sentences.  Oxygen saturation 100% on room air.  Abdominal:     Comments: Abdomen is soft, non-distended, and non-tender in all quadrants. No rigidity, no guarding. No peritoneal signs.  Musculoskeletal:        General: Normal range of motion.     Cervical back: Normal range of motion.  Skin:    General: Skin is warm and dry.     Capillary Refill: Capillary refill takes less than 2 seconds.     Comments: Raised urticaria on extremities and torso.  No rash noted on face or genitals.  Neurological:     Mental Status: He is oriented to person, place, and time.     GCS: GCS eye subscore is 4. GCS verbal subscore is 5. GCS motor subscore is 6.     Comments: Fluent speech, no facial droop.  Psychiatric:        Behavior: Behavior normal.     ED Results / Procedures / Treatments   Labs (all labs ordered are listed, but only abnormal results are displayed) Labs Reviewed - No data to display  EKG None  Radiology No results found.  Procedures Procedures   Medications Ordered in ED Medications  methylPREDNISolone sodium succinate (SOLU-MEDROL) 125 mg/2 mL injection 125 mg (125 mg Intravenous Given 09/13/20 0929)  diphenhydrAMINE (BENADRYL) injection 50 mg (50 mg Intravenous Given 09/13/20 0929)  famotidine (PEPCID) IVPB 20 mg premix (0 mg Intravenous Stopped 09/13/20 1000)    ED Course  I have reviewed the triage vital signs and the nursing notes.  Pertinent labs & imaging results that were available during my care of the patient were reviewed by me and considered in my medical decision making (see chart for details).    MDM Rules/Calculators/A&P                          History provided by patient with additional history obtained from chart review.    Patient presenting  with allergic reaction.  He is hemodynamically stable.  He has no hypoxia or tachycardia.  He has raised urticaria on extremities and torso.  No angioedema.  No signs of anaphylaxis.  Patient given IV Pepcid, Benadryl and Solu-Medrol. Patient reassessed after medications  and is sleeping comfortably.  He is no longer itching.  He was observed in the emergency department for 2 hours and symptoms have greatly improved.  Will discharge home with prescriptions for Pepcid, Benadryl and prednisone.  Patient continues to have no signs of anaphylaxis or angioedema.  Strict return precautions discussed.  Patient agreeable with plan of care.    Portions of this note were generated with Scientist, clinical (histocompatibility and immunogenetics). Dictation errors may occur despite best attempts at proofreading.   Final Clinical Impression(s) / ED Diagnoses Final diagnoses:  Allergic reaction, initial encounter    Rx / DC Orders ED Discharge Orders          Ordered    famotidine (PEPCID) 40 MG tablet  Daily        09/13/20 1044    predniSONE (DELTASONE) 10 MG tablet  Daily        09/13/20 1044    diphenhydrAMINE (BENADRYL) 25 MG tablet  Every 6 hours PRN        09/13/20 1044             Shanon Ace, PA-C 09/13/20 1047    Eber Hong, MD 09/15/20 1049

## 2020-09-13 NOTE — ED Notes (Signed)
Reviewed discharge instructions with patient. Follow-up care and medications reviewed. Patient  verbalized understanding. Patient A&Ox4, VSS, and ambulatory with steady gait upon discharge.  °

## 2020-10-15 ENCOUNTER — Ambulatory Visit (HOSPITAL_COMMUNITY)
Admission: EM | Admit: 2020-10-15 | Discharge: 2020-10-15 | Disposition: A | Discharge status: Home / Self Care | Payer: Self-pay | Attending: Student | Admitting: Student

## 2020-10-15 ENCOUNTER — Other Ambulatory Visit: Payer: Self-pay

## 2020-10-15 ENCOUNTER — Ambulatory Visit (INDEPENDENT_AMBULATORY_CARE_PROVIDER_SITE_OTHER): Payer: Self-pay

## 2020-10-15 ENCOUNTER — Encounter (HOSPITAL_COMMUNITY): Payer: Self-pay

## 2020-10-15 DIAGNOSIS — Z23 Encounter for immunization: Secondary | ICD-10-CM

## 2020-10-15 DIAGNOSIS — S81011A Laceration without foreign body, right knee, initial encounter: Secondary | ICD-10-CM

## 2020-10-15 DIAGNOSIS — S81031A Puncture wound without foreign body, right knee, initial encounter: Secondary | ICD-10-CM

## 2020-10-15 DIAGNOSIS — Z789 Other specified health status: Secondary | ICD-10-CM

## 2020-10-15 DIAGNOSIS — M25561 Pain in right knee: Secondary | ICD-10-CM

## 2020-10-15 MED ORDER — HYDROCODONE-ACETAMINOPHEN 5-325 MG PO TABS
1.0000 | ORAL_TABLET | Freq: Once | ORAL | Status: AC
Start: 2020-10-15 — End: 2020-10-15
  Administered 2020-10-15: 1 via ORAL

## 2020-10-15 MED ORDER — AMOXICILLIN-POT CLAVULANATE 875-125 MG PO TABS: 1.0000 | ORAL_TABLET | Freq: Two times a day (BID) | ORAL | 0 refills | Status: DC

## 2020-10-15 MED ORDER — HYDROCODONE-ACETAMINOPHEN 5-325 MG PO TABS: 2.0000 | ORAL_TABLET | ORAL | 0 refills | Status: DC | PRN

## 2020-10-15 MED ORDER — TETANUS-DIPHTH-ACELL PERTUSSIS 5-2.5-18.5 LF-MCG/0.5 IM SUSY
PREFILLED_SYRINGE | INTRAMUSCULAR | Status: AC
  Filled 2020-10-15: qty 0.5

## 2020-10-15 MED ORDER — HYDROCODONE-ACETAMINOPHEN 5-325 MG PO TABS
ORAL_TABLET | ORAL | Status: AC
  Filled 2020-10-15: qty 1

## 2020-10-15 MED ORDER — TETANUS-DIPHTH-ACELL PERTUSSIS 5-2.5-18.5 LF-MCG/0.5 IM SUSY
0.5000 mL | PREFILLED_SYRINGE | Freq: Once | INTRAMUSCULAR | Status: AC
  Administered 2020-10-15: 0.5 mL via INTRAMUSCULAR

## 2020-10-15 NOTE — ED Notes (Signed)
Saw patient at registration.  Patient accidentally discharged a nail gun, landing a nail in right knee.  Nail in knee 2- 2 1/2 inches.  Patient complaining of pain, patient has limited movement.  Spoke to dr hagler about patient, will evaluate here

## 2020-10-15 NOTE — ED Provider Notes (Signed)
[facility]    CSN: [id number] Arrival date & time: 10/15/20  1357      History   Chief Complaint Chief Complaint  Patient presents with   Knee Injury    HPI [name] [name] is a 45 y.o. male presenting with right knee puncture wound due to nail gun.  Medical history abdominal pain, fatty liver, pancreatitis.  Patient states that at work, he dropped a nail gun and it accidentally fell on his right knee and shot a nail into the knee.  He pulled this out at work, but he is in significant pain and is unable to straighten the leg without severe pain.  Denies sensation changes, pain or injury elsewhere.  States his tetanus is not up-to-date.  HPI  Past Medical History:  Diagnosis Date   Abdominal pain    Fatty liver    Pancreatitis     There are no problems to display for this patient.   History reviewed. No pertinent surgical history.     Home Medications    Prior to Admission medications   Medication Sig Start Date End Date Taking? Authorizing Provider  amoxicillin-clavulanate (AUGMENTIN) 875-125 MG tablet Take 1 tablet by mouth every 12 (twelve) hours. 10/15/20  Yes Rhys Martini, PA-C  HYDROcodone-acetaminophen (NORCO/VICODIN) 5-325 MG tablet Take 2 tablets by mouth every 4 (four) hours as needed. 10/15/20  Yes Rhys Martini, PA-C  diphenhydrAMINE (BENADRYL) 25 MG tablet Take 1 tablet (25 mg total) by mouth every 6 (six) hours as needed for up to 5 days. 09/13/20 09/18/20  Shanon Ace, PA-C  famotidine (PEPCID) 40 MG tablet Take 1 tablet (40 mg total) by mouth daily for 5 days. 09/14/20 09/19/20  Shanon Ace, PA-C    Family History Family History  Problem Relation Age of Onset   Diabetes Brother     Social History Social History   Tobacco Use   Smoking status: Every Day   Smokeless tobacco: Never  Vaping Use   Vaping Use: Never used  Substance Use Topics   Alcohol use: Yes    Alcohol/week: 12.0 standard drinks     Types: 12 Cans of beer per week    Comment: 12 BEERS DAILY   Drug use: No     Allergies   Patient has no known allergies.   Review of Systems Review of Systems  Musculoskeletal:        R knee puncture wound    Physical Exam Triage Vital Signs ED Triage Vitals  Enc Vitals Group     BP 10/15/20 1423 (!) 150/92     Pulse Rate 10/15/20 1423 79     Resp 10/15/20 1423 18     Temp 10/15/20 1423 98.5 F (36.9 C)     Temp Source 10/15/20 1423 Oral     SpO2 10/15/20 1423 100 %     Weight --      Height --      Head Circumference --      Peak Flow --      Pain Score 10/15/20 1421 7     Pain Loc --      Pain Edu? --      Excl. in GC? --    No data found.  Updated Vital Signs BP (!) 150/92 (BP Location: Left Arm)   Pulse 79   Temp 98.5 F (36.9 C) (Oral)   Resp 18   SpO2 100%   Visual Acuity Right Eye Distance:   Left  Eye Distance:   Bilateral Distance:    Right Eye Near:   Left Eye Near:    Bilateral Near:     Physical Exam Vitals reviewed.  Constitutional:      Appearance: Normal appearance.  HENT:     Head: Normocephalic and atraumatic.  Pulmonary:     Effort: Pulmonary effort is normal.  Musculoskeletal:     Comments: See image below.  Exam limited due to pain. R proximal patella with small 63mm round puncture wound, actively bleeding. Surrounding tenderness, particularly over quadriceps tendon. No joint laxity. Patient can partially extend leg, but with excruciating pain. Sensation intact, cap refill <2 seconds, DP 2+. In wheelchair due to pain.   Skin:    Capillary Refill: Capillary refill takes less than 2 seconds.  Neurological:     General: No focal deficit present.     Mental Status: He is alert and oriented to person, place, and time.  Psychiatric:        Mood and Affect: Mood normal.        Behavior: Behavior normal.        Thought Content: Thought content normal.        Judgment: Judgment normal.       UC Treatments / Results   Labs (all labs ordered are listed, but only abnormal results are displayed) Labs Reviewed - No data to display  EKG   Radiology DG Knee 2 Views Right  Result Date: 10/15/2020 CLINICAL DATA:  Nail gun injury below patella.  Nail removed. EXAM: RIGHT KNEE - 1-2 VIEW COMPARISON:  None. FINDINGS: No evidence of fracture, dislocation, or joint effusion. No evidence of arthropathy or other focal bone abnormality. Soft tissues are unremarkable. No retained foreign body. IMPRESSION: Negative. Electronically Signed   By: Marlan Palau M.D.   On: 10/15/2020 14:50    Procedures Procedures (including critical care time)  Medications Ordered in UC Medications  HYDROcodone-acetaminophen (NORCO/VICODIN) 5-325 MG per tablet 1 tablet (has no administration in time range)  Tdap (BOOSTRIX) injection 0.5 mL (has no administration in time range)    Initial Impression / Assessment and Plan / UC Course  I have reviewed the triage vital signs and the nursing notes.  Pertinent labs & imaging results that were available during my care of the patient were reviewed by me and considered in my medical decision making (see chart for details).     This patient is a very pleasant 45 y.o. year old male presenting with puncture wound to R knee from nailgun. Neurovascularly intact.  Xray R knee- negative. For deep wound due to nail- augmentin as below for infection prophylaxis. Tdap not up-to-date, administered today. Wound care provided and discussed. Given severe pain, I sent a short course of hydrocodone below.  F/u with ortho if symptoms worsen/persist, information provided.  ED return precautions discussed. Patient verbalizes understanding and agreement.   Spoke with this patient using Education officer, community.  Final Clinical Impressions(s) / UC Diagnoses   Final diagnoses:  Need for Tdap vaccination  Language barrier  Puncture wound of right knee, initial encounter     Discharge Instructions       -For pain: hydrocodone up to every 4-6 hours. This medication can cause drowsiness.  -You can take ibuprofen for additional relief, up to 800mg  3x daily taken with food. -Make sure your friend/coworker drives you home, since we gave you a pill of hydrocodone today -You can use the ace wrap for support and for additional relief -Ice and  elevation for pain and swelling -To prevent infection- start the antibiotic-Augmentin (amoxicillin-clavulanate), 1 pill every 12 hours for 7 days.  You can take this with food like with breakfast and dinner. -If you still can't straighten your leg in about 5 days, schedule an appointment with [facility].  You can call them or schedule this online. -Wash your wound with gentle soap and water 1-2 times daily.  Let air dry or gently pat. Use a bandaid to keep clean during the day. Don't use hydrogen peroxide or alcohol to clean the wound! -Seek additional medical attention if new symptoms like redness or swelling surrounding the wound, new fever/chills, discharge from the wound, etc.      ED Prescriptions     Medication Sig Dispense Auth. Provider   HYDROcodone-acetaminophen (NORCO/VICODIN) 5-325 MG tablet Take 2 tablets by mouth every 4 (four) hours as needed. 12 tablet Rhys Martini, PA-C   amoxicillin-clavulanate (AUGMENTIN) 875-125 MG tablet Take 1 tablet by mouth every 12 (twelve) hours. 14 tablet Rhys Martini, PA-C      I have reviewed the PDMP during this encounter.   Rhys Martini, PA-C 10/15/20 1521

## 2020-10-15 NOTE — ED Triage Notes (Signed)
Pt reports 1 snail got inside the right knee today at work.   Pt do not member when he had the last Tdap.

## 2020-10-15 NOTE — Discharge Instructions (Addendum)
-  For pain: hydrocodone up to every 4-6 hours. This medication can cause drowsiness.  -You can take ibuprofen for additional relief, up to 800mg  3x daily taken with food. -Make sure your friend/coworker drives you home, since we gave you a pill of hydrocodone today -You can use the ace wrap for support and for additional relief -Ice and elevation for pain and swelling -To prevent infection- start the antibiotic-Augmentin (amoxicillin-clavulanate), 1 pill every 12 hours for 7 days.  You can take this with food like with breakfast and dinner. -If you still can't straighten your leg in about 5 days, schedule an appointment with [facility].  You can call them or schedule this online. -Wash your wound with gentle soap and water 1-2 times daily.  Let air dry or gently pat. Use a bandaid to keep clean during the day. Don't use hydrogen peroxide or alcohol to clean the wound! -Seek additional medical attention if new symptoms like redness or swelling surrounding the wound, new fever/chills, discharge from the wound, etc.

## 2021-03-13 ENCOUNTER — Encounter (HOSPITAL_COMMUNITY): Payer: Self-pay

## 2021-03-13 ENCOUNTER — Inpatient Hospital Stay (HOSPITAL_COMMUNITY): Payer: Self-pay

## 2021-03-13 ENCOUNTER — Emergency Department (HOSPITAL_COMMUNITY): Payer: Self-pay

## 2021-03-13 ENCOUNTER — Inpatient Hospital Stay (HOSPITAL_COMMUNITY)
Admission: EM | Admit: 2021-03-13 | Discharge: 2021-03-14 | DRG: 309 | Disposition: A | Discharge status: Home / Self Care | Payer: Self-pay | Attending: Internal Medicine | Admitting: Internal Medicine

## 2021-03-13 DIAGNOSIS — I213 ST elevation (STEMI) myocardial infarction of unspecified site: Secondary | ICD-10-CM

## 2021-03-13 DIAGNOSIS — Z20822 Contact with and (suspected) exposure to covid-19: Secondary | ICD-10-CM | POA: Diagnosis present

## 2021-03-13 DIAGNOSIS — F1010 Alcohol abuse, uncomplicated: Secondary | ICD-10-CM | POA: Diagnosis present

## 2021-03-13 DIAGNOSIS — E785 Hyperlipidemia, unspecified: Secondary | ICD-10-CM | POA: Diagnosis present

## 2021-03-13 DIAGNOSIS — F17200 Nicotine dependence, unspecified, uncomplicated: Secondary | ICD-10-CM | POA: Diagnosis present

## 2021-03-13 DIAGNOSIS — Y906 Blood alcohol level of 120-199 mg/100 ml: Secondary | ICD-10-CM | POA: Diagnosis present

## 2021-03-13 DIAGNOSIS — Z833 Family history of diabetes mellitus: Secondary | ICD-10-CM

## 2021-03-13 DIAGNOSIS — F1410 Cocaine abuse, uncomplicated: Secondary | ICD-10-CM | POA: Diagnosis present

## 2021-03-13 DIAGNOSIS — R079 Chest pain, unspecified: Secondary | ICD-10-CM

## 2021-03-13 DIAGNOSIS — I77810 Thoracic aortic ectasia: Secondary | ICD-10-CM | POA: Diagnosis present

## 2021-03-13 DIAGNOSIS — I471 Supraventricular tachycardia, unspecified: Secondary | ICD-10-CM

## 2021-03-13 DIAGNOSIS — R7303 Prediabetes: Secondary | ICD-10-CM | POA: Diagnosis present

## 2021-03-13 DIAGNOSIS — Q231 Congenital insufficiency of aortic valve: Secondary | ICD-10-CM

## 2021-03-13 HISTORY — DX: Supraventricular tachycardia: I47.1

## 2021-03-13 HISTORY — DX: Alcohol abuse, uncomplicated: F10.10

## 2021-03-13 HISTORY — DX: Chest pain, unspecified: R07.9

## 2021-03-13 HISTORY — DX: Cocaine abuse, uncomplicated: F14.10

## 2021-03-13 HISTORY — DX: Supraventricular tachycardia, unspecified: I47.10

## 2021-03-13 LAB — C-REACTIVE PROTEIN: CRP: 1 mg/dL — ABNORMAL HIGH (ref <1.0)

## 2021-03-13 LAB — CBC
HCT: 44.4 % (ref 39.0–52.0)
HCT: 42.6 % (ref 39.0–52.0)
Hemoglobin: 15.6 g/dL (ref 13.0–17.0)
Hemoglobin: 14.8 g/dL (ref 13.0–17.0)
MCH: 33.4 pg (ref 26.0–34.0)
MCH: 32.9 pg (ref 26.0–34.0)
MCHC: 35.1 g/dL (ref 30.0–36.0)
MCHC: 34.7 g/dL (ref 30.0–36.0)
MCV: 95.1 fL (ref 80.0–100.0)
MCV: 94.7 fL (ref 80.0–100.0)
Platelets: 263 10*3/uL (ref 150–400)
Platelets: 259 10*3/uL (ref 150–400)
RBC: 4.67 MIL/uL (ref 4.22–5.81)
RBC: 4.5 MIL/uL (ref 4.22–5.81)
RDW: 12.6 % (ref 11.5–15.5)
RDW: 12.9 % (ref 11.5–15.5)
WBC: 8.3 10*3/uL (ref 4.0–10.5)
WBC: 7.1 10*3/uL (ref 4.0–10.5)
nRBC: 0 % (ref 0.0–0.2)
nRBC: 0 % (ref 0.0–0.2)

## 2021-03-13 LAB — BASIC METABOLIC PANEL
Anion gap: 11 (ref 5–15)
Anion gap: 10 (ref 5–15)
BUN: 10 mg/dL (ref 6–20)
BUN: 8 mg/dL (ref 6–20)
CO2: 20 mmol/L — ABNORMAL LOW (ref 22–32)
CO2: 22 mmol/L (ref 22–32)
Calcium: 8.6 mg/dL — ABNORMAL LOW (ref 8.9–10.3)
Calcium: 8.4 mg/dL — ABNORMAL LOW (ref 8.9–10.3)
Chloride: 105 mmol/L (ref 98–111)
Chloride: 108 mmol/L (ref 98–111)
Creatinine, Ser: 0.54 mg/dL — ABNORMAL LOW (ref 0.61–1.24)
Creatinine, Ser: 0.59 mg/dL — ABNORMAL LOW (ref 0.61–1.24)
GFR, Estimated: >60 mL/min (ref >60)
GFR, Estimated: >60 mL/min (ref >60)
Glucose, Bld: 113 mg/dL — ABNORMAL HIGH (ref 70–99)
Glucose, Bld: 105 mg/dL — ABNORMAL HIGH (ref 70–99)
Potassium: 3.5 mmol/L (ref 3.5–5.1)
Potassium: 3.6 mmol/L (ref 3.5–5.1)
Sodium: 136 mmol/L (ref 135–145)
Sodium: 140 mmol/L (ref 135–145)

## 2021-03-13 LAB — TROPONIN I (HIGH SENSITIVITY)
Troponin I (High Sensitivity): 13 ng/L (ref <18)
Troponin I (High Sensitivity): 11 ng/L (ref <18)
Troponin I (High Sensitivity): 9 ng/L (ref <18)

## 2021-03-13 LAB — RAPID URINE DRUG SCREEN, HOSP PERFORMED
Amphetamines: NOT DETECTED
Barbiturates: NOT DETECTED
Benzodiazepines: NOT DETECTED
Cocaine: POSITIVE — AB
Opiates: NOT DETECTED
Tetrahydrocannabinol: NOT DETECTED

## 2021-03-13 LAB — RESP PANEL BY RT-PCR (FLU A&B, COVID) ARPGX2
Influenza A by PCR: NEGATIVE
Influenza B by PCR: NEGATIVE
SARS Coronavirus 2 by RT PCR: NEGATIVE

## 2021-03-13 LAB — ECHOCARDIOGRAM COMPLETE
AR max vel: 2.71 cm2
AV Area VTI: 1.84 cm2
AV Area mean vel: 1.79 cm2
AV Mean grad: 11.5 mmHg
AV Peak grad: 10.2 mmHg
Ao pk vel: 1.59 m/s
Area-P 1/2: 3.99 cm2
Height: 64 in
S' Lateral: 3.1 cm
Weight: 2483.26 oz

## 2021-03-13 LAB — HEMOGLOBIN A1C
Hgb A1c MFr Bld: 6.1 % — ABNORMAL HIGH (ref 4.8–5.6)
Mean Plasma Glucose: 128.37 mg/dL

## 2021-03-13 LAB — PROTIME-INR
INR: 1 (ref 0.8–1.2)
Prothrombin Time: 13.2 seconds (ref 11.4–15.2)

## 2021-03-13 LAB — LIPID PANEL
Cholesterol: 216 mg/dL — ABNORMAL HIGH (ref 0–200)
HDL: 47 mg/dL (ref >40)
LDL Cholesterol: 90 mg/dL (ref 0–99)
Total CHOL/HDL Ratio: 4.6 RATIO
Triglycerides: 397 mg/dL — ABNORMAL HIGH (ref <150)
VLDL: 79 mg/dL — ABNORMAL HIGH (ref 0–40)

## 2021-03-13 LAB — ETHANOL: Alcohol, Ethyl (B): 134 mg/dL — ABNORMAL HIGH (ref <10)

## 2021-03-13 LAB — SEDIMENTATION RATE: Sed Rate: 18 mm/hr — ABNORMAL HIGH (ref 0–16)

## 2021-03-13 LAB — HIV ANTIBODY (ROUTINE TESTING W REFLEX): HIV Screen 4th Generation wRfx: NONREACTIVE

## 2021-03-13 LAB — APTT: aPTT: 29 seconds (ref 24–36)

## 2021-03-13 MED ORDER — ASPIRIN EC 81 MG PO TBEC
81.0000 mg | DELAYED_RELEASE_TABLET | Freq: Every day | ORAL | Status: DC
  Administered 2021-03-14: 08:00:00 81 mg via ORAL
  Filled 2021-03-13: qty 1

## 2021-03-13 MED ORDER — DILTIAZEM HCL 60 MG PO TABS
60.0000 mg | ORAL_TABLET | Freq: Two times a day (BID) | ORAL | Status: DC
  Administered 2021-03-13: 60 mg via ORAL
  Filled 2021-03-13 (×2): qty 1

## 2021-03-13 MED ORDER — IOHEXOL 350 MG/ML SOLN
100.0000 mL | Freq: Once | INTRAVENOUS | Status: AC | PRN
  Administered 2021-03-13: 100 mL via INTRAVENOUS

## 2021-03-13 MED ORDER — HEPARIN (PORCINE) 25000 UT/250ML-% IV SOLN
1000.0000 [IU]/h | INTRAVENOUS | Status: DC
  Administered 2021-03-13: 1000 [IU]/h via INTRAVENOUS
  Filled 2021-03-13: qty 250

## 2021-03-13 MED ORDER — ASPIRIN 81 MG PO CHEW
324.0000 mg | CHEWABLE_TABLET | Freq: Once | ORAL | Status: AC
  Administered 2021-03-13: 324 mg via ORAL
  Filled 2021-03-13: qty 4

## 2021-03-13 MED ORDER — NITROGLYCERIN 0.4 MG SL SUBL: 0.4000 mg | SUBLINGUAL_TABLET | SUBLINGUAL | Status: DC | PRN

## 2021-03-13 MED ORDER — ONDANSETRON HCL 4 MG/2ML IJ SOLN: 4.0000 mg | Freq: Four times a day (QID) | INTRAMUSCULAR | Status: DC | PRN

## 2021-03-13 MED ORDER — FOLIC ACID 1 MG PO TABS
1.0000 mg | ORAL_TABLET | Freq: Every day | ORAL | Status: DC
  Administered 2021-03-13: 1 mg via ORAL
  Filled 2021-03-13: qty 1

## 2021-03-13 MED ORDER — THIAMINE HCL 100 MG PO TABS
100.0000 mg | ORAL_TABLET | Freq: Every day | ORAL | Status: DC
  Administered 2021-03-13: 100 mg via ORAL
  Filled 2021-03-13: qty 1

## 2021-03-13 MED ORDER — HEPARIN SODIUM (PORCINE) 5000 UNIT/ML IJ SOLN
4000.0000 [IU] | Freq: Once | INTRAMUSCULAR | Status: AC
  Administered 2021-03-13: 4000 [IU] via INTRAVENOUS
  Filled 2021-03-13: qty 1

## 2021-03-13 MED ORDER — LORAZEPAM 1 MG PO TABS: 1.0000 mg | ORAL_TABLET | ORAL | Status: DC | PRN

## 2021-03-13 MED ORDER — LORAZEPAM 2 MG/ML IJ SOLN: 1.0000 mg | INTRAMUSCULAR | Status: DC | PRN

## 2021-03-13 MED ORDER — DILTIAZEM HCL ER COATED BEADS 240 MG PO CP24
240.0000 mg | ORAL_CAPSULE | Freq: Every day | ORAL | Status: DC
  Administered 2021-03-13 – 2021-03-14 (×2): 240 mg via ORAL
  Filled 2021-03-13 (×3): qty 1

## 2021-03-13 MED ORDER — NITROGLYCERIN 0.4 MG SL SUBL
0.4000 mg | SUBLINGUAL_TABLET | Freq: Once | SUBLINGUAL | Status: AC
  Administered 2021-03-13: 0.4 mg via SUBLINGUAL
  Filled 2021-03-13: qty 1

## 2021-03-13 MED ORDER — ACETAMINOPHEN 325 MG PO TABS
650.0000 mg | ORAL_TABLET | ORAL | Status: DC | PRN
  Administered 2021-03-13: 650 mg via ORAL
  Filled 2021-03-13: qty 2

## 2021-03-13 MED ORDER — SODIUM CHLORIDE 0.9 % IV SOLN: INTRAVENOUS | Status: DC

## 2021-03-13 MED ORDER — ADULT MULTIVITAMIN W/MINERALS CH
1.0000 | ORAL_TABLET | Freq: Every day | ORAL | Status: DC
  Administered 2021-03-13: 1 via ORAL
  Filled 2021-03-13: qty 1

## 2021-03-13 MED ORDER — THIAMINE HCL 100 MG/ML IJ SOLN: 100.0000 mg | Freq: Every day | INTRAMUSCULAR | Status: DC

## 2021-03-13 MED ORDER — ADENOSINE 6 MG/2ML IV SOLN
INTRAVENOUS | Status: AC
  Administered 2021-03-13: 6 mg
  Filled 2021-03-13: qty 2

## 2021-03-13 MED ORDER — HYDROMORPHONE HCL 1 MG/ML IJ SOLN
0.5000 mg | Freq: Once | INTRAMUSCULAR | Status: AC
  Administered 2021-03-13: 0.5 mg via INTRAVENOUS
  Filled 2021-03-13: qty 1

## 2021-03-13 MED ORDER — ALPRAZOLAM 0.25 MG PO TABS: 0.2500 mg | ORAL_TABLET | Freq: Two times a day (BID) | ORAL | Status: DC | PRN

## 2021-03-13 NOTE — ED Notes (Signed)
Patient eating breakfast. °

## 2021-03-13 NOTE — Progress Notes (Signed)
ANTICOAGULATION CONSULT NOTE - Initial Consult  Pharmacy Consult for Heparin  Indication: chest pain/ACS  No Known Allergies  Patient Measurements: Height: 5\' 4"  (162.6 cm) Weight: 70.4 kg (155 lb 3.3 oz) IBW/kg (Calculated) : 59.2  Vital Signs: Temp: 98.2 F (36.8 C) (12/17 0053) Temp Source: Oral (12/17 0053) BP: 107/91 (12/17 0245) Pulse Rate: 88 (12/17 0245)  Labs: Recent Labs    03/13/21 0054 03/13/21 0055 03/13/21 0220  HGB 15.6  --   --   HCT 44.4  --   --   PLT 263  --   --   APTT  --  29  --   LABPROT  --  13.2  --   INR  --  1.0  --   CREATININE 0.54*  --   --   TROPONINIHS 13  --  11    Estimated Creatinine Clearance: 97.6 mL/min (A) (by C-G formula based on SCr of 0.54 mg/dL (L)).   Medical History: Past Medical History:  Diagnosis Date   Abdominal pain    Chest pain 03/13/2021   Cocaine abuse (HCC) 03/13/2021   ETOH abuse 03/13/2021   Fatty liver    Pancreatitis    SVT (supraventricular tachycardia) (HCC) 03/13/2021     Assessment: 45 y/o M to start heparin drip for chest pain. CBC/renal function good. Planning to stop heparin drip if troponin remains flat.   Goal of Therapy:  Heparin level 0.3-0.7 units/ml Monitor platelets by anticoagulation protocol: Yes   Plan:  Heparin 4000 units BOLUS given earlier in the ED Start heparin drip at 1000 units/hr 1200 Heparin level Daily CBC/Heparin level Monitor for bleeding  54, PharmD, BCPS Clinical Pharmacist Phone: [phone]

## 2021-03-13 NOTE — ED Triage Notes (Signed)
Pt states that his heart was racing and that has now resolved but he continues to have L sided CP, vomiting earlier today.

## 2021-03-13 NOTE — ED Provider Notes (Signed)
[facility]-EMERGENCY DEPT [facility] Emergency Department Provider Note MRN:  [id number]  Arrival date & time: 03/13/21     Chief Complaint   Chest Pain   History of Present Illness   [name] [name] is a 45 y.o. year-old male with a history of SVT, cocaine abuse presenting to the ED with chief complaint of chest pain.  Location: Central chest Duration: 3 days Onset: Gradual Timing: Constant Description: Sharp Severity: Mild but occasionally severe Exacerbating/Alleviating Factors: None Associated Symptoms: Palpitations Pertinent Negatives: No cough or fever  Additional History: History of SVT.  Has been using cocaine.   Review of Systems  A complete 10 system review of systems was obtained and all systems are negative except as noted in the HPI and PMH.   Patient's Health History    Past Medical History:  Diagnosis Date   Abdominal pain    Chest pain 03/13/2021   Cocaine abuse (HCC) 03/13/2021   ETOH abuse 03/13/2021   Fatty liver    Pancreatitis    SVT (supraventricular tachycardia) (HCC) 03/13/2021    History reviewed. No pertinent surgical history.  Family History  Problem Relation Age of Onset   Diabetes Brother     Social History   Socioeconomic History   Marital status: Single    Spouse name: Not on file   Number of children: Not on file   Years of education: Not on file   Highest education level: Not on file  Occupational History   Occupation: CARPENTER  Tobacco Use   Smoking status: Every Day   Smokeless tobacco: Never  Vaping Use   Vaping Use: Never used  Substance and Sexual Activity   Alcohol use: Yes    Alcohol/week: 12.0 standard drinks    Types: 12 Cans of beer per week    Comment: 12 BEERS DAILY   Drug use: No   Sexual activity: Yes  Other Topics Concern   Not on file  Social History Narrative   Not on file   Social Determinants of Health   Financial Resource Strain: Not on file  Food Insecurity: Not on file   Transportation Needs: Not on file  Physical Activity: Not on file  Stress: Not on file  Social Connections: Not on file  Intimate Partner Violence: Not on file     Physical Exam   Vitals:   03/13/21 0230 03/13/21 0245  BP: 116/81 (!) 107/91  Pulse: 87 88  Resp: 19 (!) 27  Temp:    SpO2: 99% 98%    CONSTITUTIONAL: ill-appearing, NAD NEURO:  Alert and oriented x 3, no focal deficits EYES:  eyes equal and reactive ENT/NECK:  no LAD, no JVD CARDIO: Tachycardic rate, well-perfused, normal S1 and S2 PULM:  CTAB no wheezing or rhonchi GI/GU:  normal bowel sounds, non-distended, non-tender MSK/SPINE:  No gross deformities, no edema SKIN:  no rash, atraumatic PSYCH:  Appropriate speech and behavior  *Additional and/or pertinent findings included in MDM below  Diagnostic and Interventional Summary    EKG Interpretation  Date/Time:  Saturday March 13 2021 00:44:35 EST Ventricular Rate:  99 PR Interval:  160 QRS Duration: 112 QT Interval:  348 QTC Calculation: 446 R Axis:   192 Text Interpretation: Normal sinus rhythm Right superior axis deviation Minimal voltage criteria for LVH, may be normal variant ( Cornell product ) ST elevation consider anterolateral injury or acute infarct ** ** ACUTE MI / STEMI ** ** Abnormal ECG Confirmed by Norman Clay ([id number]) on 03/13/2021 12:50:21 AM  Labs Reviewed  BASIC METABOLIC PANEL - Abnormal; Notable for the following components:      Result Value   CO2 20 (*)    Glucose, Bld 113 (*)    Creatinine, Ser 0.54 (*)    Calcium 8.6 (*)    All other components within normal limits  HEMOGLOBIN A1C - Abnormal; Notable for the following components:   Hgb A1c MFr Bld 6.1 (*)    All other components within normal limits  LIPID PANEL - Abnormal; Notable for the following components:   Cholesterol 216 (*)    Triglycerides 397 (*)    VLDL 79 (*)    All other components within normal limits  ETHANOL - Abnormal; Notable for the following  components:   Alcohol, Ethyl (B) 134 (*)    All other components within normal limits  RESP PANEL BY RT-PCR (FLU A&B, COVID) ARPGX2  CBC  PROTIME-INR  APTT  RAPID URINE DRUG SCREEN, HOSP PERFORMED  SEDIMENTATION RATE  C-REACTIVE PROTEIN  HIV ANTIBODY (ROUTINE TESTING W REFLEX)  BASIC METABOLIC PANEL  CBC  TROPONIN I (HIGH SENSITIVITY)  TROPONIN I (HIGH SENSITIVITY)  TROPONIN I (HIGH SENSITIVITY)    DG Chest Port 1 View  Final Result      Medications  0.9 %  sodium chloride infusion ( Intravenous New Bag/Given 03/13/21 0133)  aspirin EC tablet 81 mg (has no administration in time range)  nitroGLYCERIN (NITROSTAT) SL tablet 0.4 mg (has no administration in time range)  acetaminophen (TYLENOL) tablet 650 mg (has no administration in time range)  ondansetron (ZOFRAN) injection 4 mg (has no administration in time range)  ALPRAZolam (XANAX) tablet 0.25 mg (has no administration in time range)  aspirin chewable tablet 324 mg (324 mg Oral Given 03/13/21 0058)  heparin injection 4,000 Units (4,000 Units Intravenous Given 03/13/21 0111)  adenosine (ADENOCARD) 6 MG/2ML injection (6 mg  Given 03/13/21 0113)  HYDROmorphone (DILAUDID) injection 0.5 mg (0.5 mg Intravenous Given 03/13/21 0111)  nitroGLYCERIN (NITROSTAT) SL tablet 0.4 mg (0.4 mg Sublingual Given 03/13/21 0111)     Procedures  /  Critical Care .Critical Care Performed by: Maudie Flakes, MD Authorized by: Maudie Flakes, MD   Critical care provider statement:    Critical care time (minutes):  35   Critical care was necessary to treat or prevent imminent or life-threatening deterioration of the following conditions: Concern for acute coronary syndrome, activation of STEMI protocol.   Critical care was time spent personally by me on the following activities:  Development of treatment plan with patient or surrogate, discussions with consultants, evaluation of patient's response to treatment, examination of patient, ordering  and review of laboratory studies, ordering and review of radiographic studies, ordering and performing treatments and interventions, pulse oximetry, re-evaluation of patient's condition and review of old charts .Cardioversion  Date/Time: 03/13/2021 3:03 AM Performed by: Maudie Flakes, MD Authorized by: Maudie Flakes, MD   Consent:    Consent obtained:  Verbal   Consent given by:  Patient   Risks discussed:  Induced arrhythmia Pre-procedure details:    Cardioversion basis:  Emergent   Rhythm:  Supraventricular tachycardia   Electrode placement:  Anterior-posterior Patient sedated: No Attempt one:    Cardioversion mode attempt one: 6 mg adenosine.   Shock outcome:  Conversion to normal sinus rhythm  ED Course and Medical Decision Making  I have reviewed the triage vital signs, the nursing notes, and pertinent available records from the EMR.  Listed above are laboratory and  imaging tests that I personally ordered, reviewed, and interpreted and then considered in my medical decision making (see below for details).  Chest pain, initial EKG demonstrating elevation in V2 V3, depressions in 3 and aVF.  Code STEMI initiated.  On my initial evaluation patient with heart rate of 190, appears to be SVT.  Has a history of such.  Adenosine brought into the room, patient converted spontaneously.  Repeat EKG after SVT resolved was improved, resolution of ST depressions.  Patient continued to have intermittent episodes of SVT, 1 of which was converted with adenosine as described above.  Case reviewed by cardiology with Dr. Herbie Baltimore and Dr. Lily Peer at bedside.  Suspect a lead reversal on the first EKG that caused a STEMI appearance, suspect cocaine related vasospasm, suspect symptoms largely related to SVT.  We will hold off on Cath Lab for now, admitted to the cardiology service for further care.       Elmer Sow. Pilar Plate, MD [facility] Emergency Medicine [facility]  Health [email or web address]  Final Clinical Impressions(s) / ED Diagnoses     ICD-10-CM   1. SVT (supraventricular tachycardia) (HCC)  I47.1     2. STEMI (ST elevation myocardial infarction) (HCC)  I21.3 DG Chest [facility] 1 View    DG Chest Port 1 View    3. Chest pain, unspecified type  R07.9       ED Discharge Orders     None        Discharge Instructions Discussed with and Provided to Patient:   Discharge Instructions   None       Sabas Sous, MD 03/13/21 [phone]

## 2021-03-13 NOTE — ED Notes (Addendum)
Pt went into an SVT episode with a HR up to 180 at 0326 and converted himself back to sinus rhythm. EDP was notified and at bedside. Admitting paged about pt's episode

## 2021-03-13 NOTE — Progress Notes (Signed)
°  Echocardiogram 2D Echocardiogram has been performed.  [name] [name] 03/13/2021, 3:47 PM

## 2021-03-13 NOTE — H&P (Addendum)
Cardiology Admission History and Physical:   Patient ID: [name] [name] MRN: [id number]; DOB: 07/01/1975   Admission date: 03/13/2021  PCP:  Patient, No Pcp Per (Inactive)   [facility] Providers Cardiologist:  None        Chief Complaint:  chest pain  Patient Profile:   [name] [name] is a 45 y.o. male with no PMH other than polysubstance use (ETOH, cocaine) who is being seen 03/13/2021 for the evaluation of chest pain .  History of Present Illness:   [name] [name] is a 45 y.o. male with no PMH other than polysubstance use (ETOH, cocaine) who is being seen 03/13/2021 for the evaluation of chest pain .  History obtained from the [place] interpreter. Patient reports 3 days h/o chest pain, non exertional, sharp in nature and gets better with deep breathing, non positional, non radiating. The pain comes and goes. He used cocaine last night for the chest pain and also drinks a lot of alcohol every day. Has had h/o pancreatitis in the past.  - denies any fevers, chills, cough, CHF symptoms. - last night he also noted palpitations and chest pain getting worse so came to the hospital. Last episode of chest pain started in the evening.  - Triage: he got aspirin, nitro which relieved his chest pain completely. In the ER- he also went into SVT s/p adenosine 22m x1 and then again, self converted.   Initial EKG in the triage: suspect lead reversal coz aVR is up and aVL is down and axis changed from normal to right ward. There is diffuse J point elevation in all the leads. Subsequent EKG shows SVT at HR 190 with ST elevation in V2 and diffuse J point elevation and repeat EKG post SVT is normalized. Currently chest pain free  - last cocaine use was last night, last drink was 2 hours before coming to the hospital - given atypical nature of the pain (could be pleuritic) or spasm, currently chest pain free, 1st set of trop is negative and EKG- changes have normalized, we  cancelled STEMI.  Dr HEllyn Hackevaluated the patient In the ER as well, appreciate recs.  Past Medical History:  Diagnosis Date   Abdominal pain    Chest pain 03/13/2021   [place] ([place] 03/13/20[place]   ETOH abuse 03/13/2021   Fatty liver    Pancreatitis    [place]cardia) [place] 03/13/[place]    History reviewed. No pertinent surgical history.   Medications Prior to Admission: Prior to Admission medications   Medication Sig Start Date End Date Taking? Authorizing Provider  amoxicillin-clavulanate (AUGMENTIN) 875-125 MG tablet Take 1 tablet by mouth every 12 (twelve) hours. 10/15/20   GHazel Sams PA-C  diphenhydrAMINE (BENADRYL) 25 MG tablet Take 1 tablet (25 mg total) by mouth every 6 (six) hours as needed for up to 5 days. 09/13/20 09/18/20  WBarrie Folk PA-C  famotidine (PEPCID) 40 MG tablet Take 1 tablet (40 mg total) by mouth daily for 5 days. 09/14/20 09/19/20  WBarrie Folk PA-C  HYDROcodone-acetaminophen (NORCO/VICODIN) 5-325 MG tablet Take 2 tablets by mouth every 4 (four) hours as needed. 10/15/20   GHazel Sams PA-C     Allergies:   No Known Allergies  Social History:   Social History   Socioeconomic History   Marital status: Single    Spouse name: Not on file   Number of children: Not on file   Years of education: Not on file   Highest education  level: Not on file  Occupational History   Occupation: CARPENTER  Tobacco Use   Smoking status: Every Day   Smokeless tobacco: Never  Vaping Use   Vaping Use: Never used  Substance and Sexual Activity   Alcohol use: Yes    Alcohol/week: 12.0 standard drinks    Types: 12 Cans of beer per week    Comment: 12 BEERS DAILY   Drug use: No   Sexual activity: Yes  Other Topics Concern   Not on file  Social History Narrative   Not on file   Social Determinants of Health   Financial Resource Strain: Not on file  Food Insecurity: Not on file  Transportation Needs: Not on file   Physical Activity: Not on file  Stress: Not on file  Social Connections: Not on file  Intimate Partner Violence: Not on file    Family History:   The patient's family history includes Diabetes in his brother.    ROS:  Please see the history of present illness.  All other ROS reviewed and negative.     Physical Exam/Data:   Vitals:   03/13/21 0130 03/13/21 0142 03/13/21 0145 03/13/21 0200  BP: (!) 121/98  123/88 (!) 112/97  Pulse: 100  95 90  Resp: '17  18 17  ' Temp:      TempSrc:      SpO2: 99%  98% 99%  Height:  '5\' 4"'  (1.626 m)     No intake or output data in the 24 hours ending 03/13/21 0226 No flowsheet data found.   There is no height or weight on file to calculate BMI.  General:  Well nourished, well developed, in no acute distress HEENT: normal Neck: no JVD Vascular: No carotid bruits; Distal pulses 2+ bilaterally   Cardiac:  normal S1, S2; RRR; no murmur, could not appreciate any rubs Lungs:  clear to auscultation bilaterally, no wheezing, rhonchi or rales  Abd: soft, nontender, no hepatomegaly  Ext: no edema Musculoskeletal:  No deformities, BUE and BLE strength normal and equal Skin: warm and dry  Neuro:  CNs 2-12 intact, no focal abnormalities noted Psych:  Normal affect    EKG:  The ECG that was done  was personally reviewed and demonstrates as noted above  Relevant CV Studies: none  Laboratory Data:  High Sensitivity Troponin:   Recent Labs  Lab 03/13/21 0054  TROPONINIHS 13      Chemistry Recent Labs  Lab 03/13/21 0054  NA 136  K 3.5  CL 105  CO2 20*  GLUCOSE 113*  BUN 10  CREATININE 0.54*  CALCIUM 8.6*  GFRNONAA >60  ANIONGAP 11    No results for input(s): PROT, ALBUMIN, AST, ALT, ALKPHOS, BILITOT in the last 168 hours. Lipids  Recent Labs  Lab 03/13/21 0055  CHOL 216*  TRIG 397*  HDL 47  LDLCALC 90  CHOLHDL 4.6   Hematology Recent Labs  Lab 03/13/21 0054  WBC 8.3  RBC 4.67  HGB 15.6  HCT 44.4  MCV 95.1  MCH 33.4   MCHC 35.1  RDW 12.6  PLT 263   Thyroid No results for input(s): TSH, FREET4 in the last 168 hours. BNPNo results for input(s): BNP, PROBNP in the last 168 hours.  DDimer No results for input(s): DDIMER in the last 168 hours.   Radiology/Studies:  DG Chest Port 1 View  Result Date: 03/13/2021 CLINICAL DATA:  Tachycardia and left-sided chest pain, initial encounter EXAM: PORTABLE CHEST 1 VIEW COMPARISON:  12/14/2010 FINDINGS: The  heart size and mediastinal contours are within normal limits. Both lungs are clear. The visualized skeletal structures are unremarkable. IMPRESSION: No active disease. Electronically Signed   By: Inez Catalina M.D.   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 There is diffuse J point elevation in all the leads. Subsequent EKG shows SVT at HR 190 with ST elevation in V2 and diffuse J point elevation and repeat EKG post SVT is normalized. Currently chest pain free  - last cocaine use was last night, last drink was 2 hours before coming to the hospital - given atypical nature of the pain (could be pleuritic) or spasm, currently chest pain free, 1st set of trop is negative and EKG- changes have normalized, we cancelled STEMI.  Dr Ellyn Hack evaluated the patient In the ER as well, appreciate recs.   - continue heparin gtt meanwhile, aspirin 83m  - HOLD any Bb given active cocaine use - use benzos or CCB if having chest pain - trend trops, EKG - ECHO in am  - if trops remain negative- d/c heparin gtt - check ESR/CRP, utox - supportive care and aggressive risk factor modification.  Risk Assessment/Risk Scores:    HEAR Score (for undifferentiated chest pain):  HEAR Score: 2       Severity of Illness: The  appropriate patient status for this patient is INPATIENT. Inpatient status is judged to be reasonable and necessary in order to provide the required intensity of service to ensure the patient's safety. The patient's presenting symptoms, physical exam findings, and initial radiographic and laboratory data in the context of their chronic comorbidities is felt to place them at high risk for further clinical deterioration. Furthermore, it is not anticipated that the patient will be medically stable for discharge from the hospital within 2 midnights of admission.   * I certify that at the point of admission it is my clinical judgment that the patient will require inpatient hospital care spanning beyond 2 midnights from the point of admission due to high intensity of service, high risk for further deterioration and high frequency of surveillance required.*   For questions or updates, please contact [place] consult [email or web address] for contact info under     Signed, RRenae Fickle MD  03/13/2021 2:26 AM   Personally seen and examined. Agree with on-call cardiology above with the following comments: Briefly 89you M with a history of SVT in 2012, with prior hx of Alcohol (alcohol pancreatitis) and cocaine use (no prior withdrawal) who presents with chest pain. Interpretor service used. Patient notes that his chest pain is much improved.  No SOB Exam notable for normal rate and rhythm, CIWA 2 (did not assess serial additions), no SOB. Labs notable for flat troponin Personally reviewed relevant tests, no further SVT Would recommend  - schedule diltiazem to XL - pending echo (given there was a potential cath lab activation and prior SVT, reasonable to evaluate for structural issues - potential 03/14/21 DC  MRudean Haskell MD [place] [id number] [place] [phone] 2:03 PM

## 2021-03-13 NOTE — Progress Notes (Addendum)
Brief Interventional Cardiology Note.   The patient was seen and evaluated by the on-call fellow.  We discussed via telephone and brief evaluation of the patient.  He came in having had chest pain since roughly either 4:30 PM or 6 PM yesterday evening.  The previous evening he used cocaine.  He had chest pain last night but was not as significant.  All day long today Friday, he was able to work and be active without any symptoms, but the evening of the 16th, he started having chest pain.  Did not necessarily notice the heart racing episodes, but thinks that may have been happening as well.  Upon arrival to the ER he was evaluated and EKG suggested possible lateral ST elevations with inferior depressions, but on further evaluation the EKG leads appeared to be flipped in the limb leads as there was no R waves in 1 and aVL.  He then went into SVT which broke spontaneously.  He had 1 more episode of SVT both rates in the 190s.  After spontaneously converting the second time EKG no longer showed any significant changes.  By 1:45 AM, he was chest pain-free, hemodynamically stable.  EKG with no ischemic changes.  I suspect that this may be cocaine related spasm and induced SVT.  He did have a similar several SVT back in 2012 but at that time had not used cocaine.  He still had chest pain at that time as well.  Based on the on-call cardiology fellow's evaluation and EKG assessment MI brief interaction with the case, I agree that we can cancel code STEMI.  Would continue evaluation in the ER and general cardiology will continue to evaluate for possible admission.   Bryan Lemma, MD

## 2021-03-14 ENCOUNTER — Encounter (HOSPITAL_COMMUNITY): Payer: Self-pay | Admitting: Cardiology

## 2021-03-14 ENCOUNTER — Other Ambulatory Visit: Payer: Self-pay

## 2021-03-14 DIAGNOSIS — I7121 Aneurysm of the ascending aorta, without rupture: Secondary | ICD-10-CM

## 2021-03-14 DIAGNOSIS — Q231 Congenital insufficiency of aortic valve: Secondary | ICD-10-CM

## 2021-03-14 MED ORDER — DILTIAZEM HCL ER COATED BEADS 240 MG PO CP24: 240.0000 mg | ORAL_CAPSULE | Freq: Every day | ORAL | 1 refills | Status: AC

## 2021-03-14 MED ORDER — DIPHENHYDRAMINE HCL 25 MG PO CAPS
25.0000 mg | ORAL_CAPSULE | Freq: Four times a day (QID) | ORAL | Status: DC | PRN
  Administered 2021-03-14: 08:00:00 25 mg via ORAL
  Filled 2021-03-14 (×2): qty 1

## 2021-03-14 NOTE — Progress Notes (Addendum)
Progress Note  Patient Name: [name] [name] Date of Encounter: 03/14/2021  [facility] Cardiologist: Werner Lean, MD   Subjective   Used interpreter services. He denies any dyspnea or palpitations. Brief chest pain this AM lasting less than 2 minutes and spontaneously resolved. Feels back to baseline and denies any symptoms at this time.   Inpatient Medications    Scheduled Meds:  aspirin EC  81 mg Oral Daily   diltiazem  240 mg Oral Daily   folic acid  1 mg Oral Daily   multivitamin with minerals  1 tablet Oral Daily   thiamine  100 mg Oral Daily   Or   thiamine  100 mg Intravenous Daily   Continuous Infusions:  sodium chloride 20 mL/hr at 03/13/21 0133   PRN Meds: acetaminophen, ALPRAZolam, diphenhydrAMINE, LORazepam **OR** LORazepam, nitroGLYCERIN, ondansetron (ZOFRAN) IV   Vital Signs    Vitals:   03/13/21 1630 03/13/21 1700 03/13/21 1800 03/13/21 2203  BP: (!) 140/95 124/85 (!) 130/94 (!) 119/92  Pulse: 85 63 76 67  Resp: 16 14 18 16   Temp:    98.2 F (36.8 C)  TempSrc:    Oral  SpO2: 96% 98% 99% 99%  Weight:      Height:        Intake/Output Summary (Last 24 hours) at 03/14/2021 0842 Last data filed at 03/14/2021 0130 Gross per 24 hour  Intake 250.95 ml  Output 850 ml  Net -599.05 ml   Last 3 Weights 03/13/2021  Weight (lbs) 155 lb 3.3 oz  Weight (kg) 70.4 kg      Telemetry    NSR, HR in 60's to 80's. No recurrent SVT. - Personally Reviewed  ECG    NSR, HR 61 with LAFB and LVH with associated repol abnormalities.  - Personally Reviewed  Physical Exam   GEN: Pleasant male appearing in no acute distress.   Neck: No JVD Cardiac: RRR, no murmurs, rubs, or gallops.  Respiratory: Clear to auscultation bilaterally. GI: Soft, nontender, non-distended  MS: No pitting edema; No deformity. Neuro:  Nonfocal  Psych: Normal affect   Labs    High Sensitivity Troponin:   Recent Labs  Lab 03/13/21 0054 03/13/21 0220  03/13/21 0545  TROPONINIHS 13 11 9      Chemistry Recent Labs  Lab 03/13/21 0054 03/13/21 0545  NA 136 140  K 3.5 3.6  CL 105 108  CO2 20* 22  GLUCOSE 113* 105*  BUN 10 8  CREATININE 0.54* 0.59*  CALCIUM 8.6* 8.4*  GFRNONAA >60 >60  ANIONGAP 11 10    Lipids  Recent Labs  Lab 03/13/21 0055  CHOL 216*  TRIG 397*  HDL 47  LDLCALC 90  CHOLHDL 4.6    Hematology Recent Labs  Lab 03/13/21 0054 03/13/21 0545  WBC 8.3 7.1  RBC 4.67 4.50  HGB 15.6 14.8  HCT 44.4 42.6  MCV 95.1 94.7  MCH 33.4 32.9  MCHC 35.1 34.7  RDW 12.6 12.9  PLT 263 259   Thyroid No results for input(s): TSH, FREET4 in the last 168 hours.  BNPNo results for input(s): BNP, PROBNP in the last 168 hours.  DDimer No results for input(s): DDIMER in the last 168 hours.   Radiology    DG Chest Port 1 View  Result Date: 03/13/2021 CLINICAL DATA:  Tachycardia and left-sided chest pain, initial encounter EXAM: PORTABLE CHEST 1 VIEW COMPARISON:  12/14/2010 FINDINGS: The heart size and mediastinal contours are within normal limits. Both lungs are  clear. The visualized skeletal structures are unremarkable. IMPRESSION: No active disease. Electronically Signed   By: Inez Catalina M.D.   On: 03/13/2021 01:16   CT ANGIO CHEST AORTA W/ & OR WO/CM & GATING ([place] ONLY)  Result Date: 03/14/2021 CLINICAL DATA:  Thoracic aortic disease, possible coarctation EXAM: CT ANGIOGRAPHY CHEST WITH CONTRAST TECHNIQUE: Multidetector CT imaging of the chest was performed using the standard protocol during bolus administration of intravenous contrast. Multiplanar CT image reconstructions and MIPs were obtained to evaluate the vascular anatomy. CONTRAST:  193mL OMNIPAQUE IOHEXOL 350 MG/ML SOLN COMPARISON:  None. FINDINGS: Cardiovascular: No evidence of acute intramural hematoma on initial noncontrast enhanced images. Following administration of intravenous contrast, there is excellent opacification of the thoracic aorta. Two  vessel arch anatomy. The right brachiocephalic and left common carotid arteries share a common origin. The aortic root is normal in caliber at 3.3 cm as measured at the sinuses of Valsalva. There is no effacement of the sino-tubular junction. 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 The transverse and descending thoracic aorta are normal in caliber. No evidence of coarctation. The aortic valve is thickened and partially calcified and essentially bicuspid with only left and right coronary cusps. The non coronary cusp is under developed. The heart is normal in size. The main pulmonary artery is normal in size. No pulmonary emboli. No pericardial effusion. Mediastinum/Nodes: Unremarkable CT appearance of the thyroid gland. No suspicious mediastinal or hilar adenopathy. No soft tissue mediastinal mass. The thoracic esophagus is unremarkable. Lungs/Pleura: Calcified granuloma in the anterior aspect of the left upper lobe noted incidentally. No suspicious mass or nodule. The lungs are otherwise clear. Upper Abdomen: Marked hypoattenuation of the hepatic parenchyma consistent with hepatic steatosis. No acute abnormality within the upper abdomen. Musculoskeletal: No acute fracture or aggressive appearing lytic or blastic osseous lesion. Review of the MIP images confirms the above findings. IMPRESSION: 1. 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 Recommend annual imaging followup by CTA or MRA. This recommendation follows 2010 ACCF/AHA/AATS/ACR/ASA/SCA/SCAI/SIR/STS/SVM Guidelines for the Diagnosis and Management of Patients with Thoracic Aortic Disease. Circulation. 2010; 121ML:4928372. Aortic aneurysm NOS (ICD10-I71.9). 2. The aortic root is normal in caliber and there is no effacement of the sino-tubular junction. 3. No  evidence of aortic coarctation. 4. 2 vessel aortic arch. The right brachiocephalic and left common carotid artery share a common origin. 5. Hepatic steatosis. Electronically Signed   By: Jacqulynn Cadet M.D.   On: 03/14/2021 07:05    Cardiac Studies   Echocardiogram: 03/13/2021 IMPRESSIONS     1. Patient appears to have a bicuspid aortic valve, ascending aortic  dilation (measuring 4.4cm), and suspected coarctation of the aorta with  discreet narrowing and turbulent flow detected by color doppler just  distal to the left subclavian artery. There   is a drop in gradient from the pre to the post stenotic region detected  by pulse wave doppler. There is also a persistent gradient into diastole.  Recommend CTA of the aorta for further evaluation.   2. Left ventricular ejection fraction, by estimation, is 55 to 60%. The  left ventricle has normal function. The left ventricle has no regional  wall motion abnormalities. Left ventricular diastolic parameters were  normal.   3. Right ventricular systolic function is normal. The right ventricular  size is normal.  4. The mitral valve is normal in structure. Trivial mitral valve  regurgitation.   5. The aortic valve is bicuspid. There is mild calcification of the  aortic valve. There is mild thickening of the aortic valve. Aortic valve  regurgitation is not visualized. Mild aortic valve stenosis.   6. Suspect coarctation of the aorta. Recommend CTA for further  evaluation. Aortic dilatation noted. There is moderate dilatation of the  ascending aorta, measuring 44 mm.   7. The inferior vena cava is dilated in size with <50% respiratory  variability, suggesting right atrial pressure of 15 mmHg.   Comparison(s): No prior Echocardiogram.   Patient Profile     45 y.o. male with past medical history of polysubstance abuse (ETOH and cocaine use) who is currently admitted for evaluation of chest pain.   Assessment & Plan    1. Atypical Chest  Pain - Presented with a 3-day history of chest pain which was sharp and not associated with exertion. Was using cocaine with "improvement" in his symptoms. Initial EKG showed lateral ST elevations and inferior depressions but tracings improved and CODE STEMI was cancelled.  - Hs Troponin values have been negative at 13, 11 and 9. Echo shows EF of 55-60% with no regional WMA.  - No plans for further ischemic testing at this time.   2. SVT - Had an episode of SVT while in the ED and received Adenosine and had a 2nd episode which broke spontaneously. Was started on CCB over BB given active cocaine use. This has been transitioned to Cardizem CD 240mg  daily and telemetry shows no recurrence.   3. Bicuspid Aortic Valve/Ascending Aorta Dilatation - Echo shows a bicuspid aortic valve and ascending aortic dilation of 4.4cm and suspected coarctation of the aorta with discreet narrowing and turbulent flow detected by color doppler just distal to the left subclavian artery and CTA recommended for further evaluation. CTA showed a bicuspid valve with mild dilation of 4 cm with annual imaging recommended.   4. Substance Abuse - Ethanol at 134 on admission and UDS positive for cocaine. Cessation advised. On CIWA protocol at this time.     For questions or updates, please contact [facility] Please consult [email or web address] for contact info under        Signed, , PA-C  03/14/2021, 8:42 AM    Personally seen and examined. Agree with APP above with the following comments: Briefly 45 yo M with a history of SVT who presents with SOB Patient notes that he has felt better back on diltiazem.  No CP, Sob, PND orthopnea Exam notable for notable systolic crescendo murmur.  No change in blood pressure right to left arm. 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  We had discussed substance cessation at length - his daughter has a bicuspid aortic valve in 54 and gets follow up, no family hx SCD - will need follow up; patient to establish with me. - safe for discharge

## 2021-03-14 NOTE — Progress Notes (Signed)
To complete Admission Assessment, the Video Interpreter service was used.  Session # [id number].   Patient gave me a number to contact family to notify them of his admission:  [phone] but there was no answer after two attempts to contact someone.

## 2021-03-14 NOTE — Plan of Care (Signed)
  Problem: Education: Goal: Understanding of cardiac disease, CV risk reduction, and recovery process will improve Outcome: Adequate for Discharge Goal: Individualized Educational Video(s) Outcome: Adequate for Discharge   Problem: Activity: Goal: Ability to tolerate increased activity will improve Outcome: Adequate for Discharge   Problem: Cardiac: Goal: Ability to achieve and maintain adequate cardiovascular perfusion will improve Outcome: Adequate for Discharge   Problem: Health Behavior/Discharge Planning: Goal: Ability to safely manage health-related needs after discharge will improve Outcome: Adequate for Discharge   

## 2021-03-14 NOTE — Discharge Summary (Signed)
Discharge Summary    Patient ID: [name] [name] MRN: [id number]; DOB: 1975/05/03  Admit date: 03/13/2021 Discharge date: 03/14/2021  PCP:  Patient, No Pcp Per (Inactive)   [facility] Providers Cardiologist:  Christell Constant, MD        Discharge Diagnoses    Principal Problem:   Chest pain Active Problems:   SVT (supraventricular tachycardia) (HCC)   ETOH abuse   Cocaine abuse (HCC)   Diagnostic Studies/Procedures    Echocardiogram: 03/13/2021 IMPRESSIONS     1. Patient appears to have a bicuspid aortic valve, ascending aortic  dilation (measuring 4.4cm), and suspected coarctation of the aorta with  discreet narrowing and turbulent flow detected by color doppler just  distal to the left subclavian artery. There   is a drop in gradient from the pre to the post stenotic region detected  by pulse wave doppler. There is also a persistent gradient into diastole.  Recommend CTA of the aorta for further evaluation.   2. Left ventricular ejection fraction, by estimation, is 55 to 60%. The  left ventricle has normal function. The left ventricle has no regional  wall motion abnormalities. Left ventricular diastolic parameters were  normal.   3. Right ventricular systolic function is normal. The right ventricular  size is normal.   4. The mitral valve is normal in structure. Trivial mitral valve  regurgitation.   5. The aortic valve is bicuspid. There is mild calcification of the  aortic valve. There is mild thickening of the aortic valve. Aortic valve  regurgitation is not visualized. Mild aortic valve stenosis.   6. Suspect coarctation of the aorta. Recommend CTA for further  evaluation. Aortic dilatation noted. There is moderate dilatation of the  ascending aorta, measuring 44 mm.   7. The inferior vena cava is dilated in size with <50% respiratory  variability, suggesting right atrial pressure of 15 mmHg.   Comparison(s): No prior Echocardiogram.    History of Present Illness     [name] [name] is a 45 y.o. male with past medical history of polysubstance use (ETOH and cocaine) who presented to [facility] ED on 03/13/2021 for evaluation of chest pain.   He reported having chest pain for 3 days prior to admission which would occur at rest and was a sharp pain that would improve with deep breathing. He also reported using Cocaine to see if this would help with the pain.   While in the ED, he went into SVT and received Adenosine 6mg  with conversion to NSR. Had a recurrent episode while in the ED and self-converted. CODE STEMI was activated while he was in the ED due to his EKG showing lateral ST elevations and inferior depressions but this improved on repeat tracings and CODE STEMI was cancelled. He was admitted for further management of his chest pain and SVT.   Hospital Course     Consultants: None   He maintained NSR once on the floor and was transitioned to Cardizem CD 240mg  daily. Hs Troponin values remained negative. Ethanol was elevated to 134 (CIWA protocol initiated) and UDS was positive for cocaine. Echocardiogram was obtained and showed a preserved EF with no regional WMA. He did have a bicuspid aortic valve and ascending aortic dilation of 4.4 cm and suspected coarctation of the aorta with narrowing and turbulent flow detected by color doppler just distal to the left subclavian artery and CTA recommended for further evaluation. CTA showed a bicuspid valve with mild dilation of 4 cm with annual  imaging recommended.   The following morning, he denied any recurrent palpitations or dyspnea. Had experienced occasional shooting pain along his chest for less than 2 minutes but no symptoms resembling his initial episode. He was examined by Dr. Izora Ribas and deemed stable for discharge and will follow-up with him at the [facility] in several months.   Did the patient have an acute coronary syndrome (MI, NSTEMI, STEMI,  etc) this admission?:  No                               Did the patient have a percutaneous coronary intervention (stent / angioplasty)?:  No.    _____________  Discharge Vitals Blood pressure (!) 119/92, pulse 67, temperature 98.2 F (36.8 C), temperature source Oral, resp. rate 16, height 5\' 4"  (1.626 m), weight 70.4 kg, SpO2 99 %.  Filed Weights   03/13/21 0330  Weight: 70.4 kg    Labs & Radiologic Studies    CBC Recent Labs    03/13/21 0054 03/13/21 0545  WBC 8.3 7.1  HGB 15.6 14.8  HCT 44.4 42.6  MCV 95.1 94.7  PLT 263 259   Basic Metabolic Panel Recent Labs    03/15/21 0054 03/13/21 0545  NA 136 140  K 3.5 3.6  CL 105 108  CO2 20* 22  GLUCOSE 113* 105*  BUN 10 8  CREATININE 0.54* 0.59*  CALCIUM 8.6* 8.4*   Liver Function Tests No results for input(s): AST, ALT, ALKPHOS, BILITOT, PROT, ALBUMIN in the last 72 hours. No results for input(s): LIPASE, AMYLASE in the last 72 hours. High Sensitivity Troponin:   Recent Labs  Lab 03/13/21 0054 03/13/21 0220 03/13/21 0545  TROPONINIHS 13 11 9     BNP Invalid input(s): POCBNP D-Dimer No results for input(s): DDIMER in the last 72 hours. Hemoglobin A1C Recent Labs    03/13/21 0055  HGBA1C 6.1*   Fasting Lipid Panel Recent Labs    03/13/21 0055  CHOL 216*  HDL 47  LDLCALC 90  TRIG 397*  CHOLHDL 4.6   Thyroid Function Tests No results for input(s): TSH, T4TOTAL, T3FREE, THYROIDAB in the last 72 hours.  Invalid input(s): FREET3 _____________  DG Chest Port 1 View  Result Date: 03/13/2021 CLINICAL DATA:  Tachycardia and left-sided chest pain, initial encounter EXAM: PORTABLE CHEST 1 VIEW COMPARISON:  12/14/2010 FINDINGS: The heart size and mediastinal contours are within normal limits. Both lungs are clear. The visualized skeletal structures are unremarkable. IMPRESSION: No active disease. Electronically Signed   By: 03/15/2021 M.D.   On: 03/13/2021 01:16    CT ANGIO CHEST AORTA W/ & OR WO/CM &  GATING ([place] ONLY)  Result Date: 03/14/2021 CLINICAL DATA:  Thoracic aortic disease, possible coarctation EXAM: CT ANGIOGRAPHY CHEST WITH CONTRAST TECHNIQUE: Multidetector CT imaging of the chest was performed using the standard protocol during bolus administration of intravenous contrast. Multiplanar CT image reconstructions and MIPs were obtained to evaluate the vascular anatomy. CONTRAST:  03/15/2021 OMNIPAQUE IOHEXOL 350 MG/ML SOLN COMPARISON:  None. FINDINGS: Cardiovascular: No evidence of acute intramural hematoma on initial noncontrast enhanced images. Following administration of intravenous contrast, there is excellent opacification of the thoracic aorta. Two vessel arch anatomy. The right brachiocephalic and left common carotid arteries share a common origin. The aortic root is normal in caliber at 3.3 cm as measured at the sinuses of Valsalva. There is no effacement of the sino-tubular junction. However, there is mild aneurysmal  dilation of the tubular portion of the ascending thoracic aorta with a maximal diameter of 4 cm. The transverse and descending thoracic aorta are normal in caliber. No evidence of coarctation. The aortic valve is thickened and partially calcified and essentially bicuspid with only left and right coronary cusps. The non coronary cusp is under developed. The heart is normal in size. The main pulmonary artery is normal in size. No pulmonary emboli. No pericardial effusion. Mediastinum/Nodes: Unremarkable CT appearance of the thyroid gland. No suspicious mediastinal or hilar adenopathy. No soft tissue mediastinal mass. The thoracic esophagus is unremarkable. Lungs/Pleura: Calcified granuloma in the anterior aspect of the left upper lobe noted incidentally. No suspicious mass or nodule. The lungs are otherwise clear. Upper Abdomen: Marked hypoattenuation of the hepatic parenchyma consistent with hepatic steatosis. No acute abnormality within the upper abdomen. Musculoskeletal: No  acute fracture or aggressive appearing lytic or blastic osseous lesion. Review of the MIP images confirms the above findings. IMPRESSION: 1. 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 Recommend annual imaging followup by CTA or MRA. This recommendation follows 2010 ACCF/AHA/AATS/ACR/ASA/SCA/SCAI/SIR/STS/SVM Guidelines for the Diagnosis and Management of Patients with Thoracic Aortic Disease. Circulation. 2010; 121: Q759-F638. Aortic aneurysm NOS (ICD10-I71.9). 2. The aortic root is normal in caliber and there is no effacement of the sino-tubular junction. 3. No evidence of aortic coarctation. 4. 2 vessel aortic arch. The right brachiocephalic and left common carotid artery share a common origin. 5. Hepatic steatosis. Electronically Signed   By: Malachy Moan M.D.   On: 03/14/2021 07:05   Disposition   Pt is being discharged home today in good condition.  Follow-up Plans & Appointments     Follow-up Information     Christell Constant, MD Follow up on 05/13/2021.   Specialty: Cardiology Why: Cardiology Follow-up on 05/13/2021 at 9:20 AM. Contact information: [address] [place] [address] [phone]                Discharge Instructions     Diet - low sodium heart healthy   Complete by: As directed        Discharge Medications   Allergies as of 03/14/2021   No Known Allergies      Medication List     STOP taking these medications    amoxicillin-clavulanate 875-125 MG tablet Commonly known as: AUGMENTIN   aspirin 325 MG tablet   diphenhydrAMINE 25 MG tablet Commonly known as: BENADRYL   famotidine 40 MG tablet Commonly known as: PEPCID   HYDROcodone-acetaminophen 5-325 MG tablet Commonly known as: NORCO/VICODIN       TAKE these medications    CLEAR EYES OP Place 1 drop into both eyes daily as needed (For dry eyes).    diltiazem 240 MG 24 hr capsule Commonly known as: CARDIZEM CD Take 1 capsule (240 mg total) by mouth daily. Start taking on: March 15, 2021           Outstanding Labs/Studies   None  Duration of Discharge Encounter   Greater than 30 minutes including physician time.  Signed, Ellsworth Lennox, PA-C 03/14/2021, 9:37 AM

## 2021-03-14 NOTE — Plan of Care (Signed)
  Problem: Activity: Goal: Ability to tolerate increased activity will improve Outcome: Progressing   Problem: Cardiac: Goal: Ability to achieve and maintain adequate cardiovascular perfusion will improve Outcome: Progressing   

## 2021-05-12 NOTE — Progress Notes (Unsigned)
Cardiology Office Note:    Date:  05/12/2021   ID:  [name] [name], DOB June 14, 1975, MRN [id number]  PCP:  Patient, No Pcp Per (Inactive)   [facility] Providers Cardiologist:  Christell Constant, MD { Click to update primary MD,subspecialty MD or APP then REFRESH:1}    Referring MD: No ref. provider found   CC: Bicuspid follow up  History of Present Illness:    [name] [name] is a 46 y.o. male with a hx of substance abuse (recent cocaine, alcohol abuse), hx of SVT, who is seen after follow up for cocaine mediated chest pain.  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  He has a family history of Bicuspid valve without dissection.  Patient notes that he is doing ***.   Since day prior/last visit notes *** . There are no*** interval hospital/ED visit.    No chest pain or pressure ***.  No SOB/DOE*** and no PND/Orthopnea***.  No weight gain or leg swelling***.  No palpitations or syncope ***.  Ambulatory blood pressure ***.   Past Medical History:  Diagnosis Date   Abdominal pain    Chest pain 03/13/2021   Cocaine abuse (HCC) 03/13/2021   ETOH abuse 03/13/2021   Fatty liver    Pancreatitis    SVT (supraventricular tachycardia) (HCC) 03/13/2021    No past surgical history on file.  Current Medications: No outpatient medications have been marked as taking for the 05/13/21 encounter (Appointment) with Christell Constant, MD.     Allergies:   Patient has no known allergies.   Social History   Socioeconomic History   Marital status: Single    Spouse name: Not on file   Number of children: Not on file   Years of education: Not on file   Highest education level: Not on file  Occupational History   Occupation: CARPENTER  Tobacco Use   Smoking status: Every Day   Smokeless tobacco: Never  Vaping Use   Vaping Use: Never used  Substance and Sexual Activity   Alcohol use: Yes    Alcohol/week: 12.0 standard  drinks    Types: 12 Cans of beer per week    Comment: 12 BEERS DAILY   Drug use: No   Sexual activity: Yes  Other Topics Concern   Not on file  Social History Narrative   Not on file   Social Determinants of Health   Financial Resource Strain: Not on file  Food Insecurity: Not on file  Transportation Needs: Not on file  Physical Activity: Not on file  Stress: Not on file  Social Connections: Not on file     Family History: The patient's family history includes Diabetes in his brother.  ROS:   Please see the history of present illness.     All other systems reviewed and are negative.  EKGs/Labs/Other Studies Reviewed:    The following studies were reviewed today:   EKG:  EKG is *** ordered today.  The ekg ordered today demonstrates *** 05/12/21: ***  Cardiac Event Monitoring***: Date: Results:  Transthoracic Echocardiogram: Date: 03/13/21 Results:  1. Patient appears to have a bicuspid aortic valve, ascending aortic  dilation (measuring 4.4cm), and suspected coarctation of the aorta with  discreet narrowing and turbulent flow detected by color doppler just  distal to the left subclavian artery. There   is a drop in gradient from the pre to the post stenotic region detected  by pulse wave doppler. There is also a  persistent gradient into diastole.  Recommend CTA of the aorta for further evaluation.   2. Left ventricular ejection fraction, by estimation, is 55 to 60%. The  left ventricle has normal function. The left ventricle has no regional  wall motion abnormalities. Left ventricular diastolic parameters were  normal.   3. Right ventricular systolic function is normal. The right ventricular  size is normal.   4. The mitral valve is normal in structure. Trivial mitral valve  regurgitation.   5. The aortic valve is bicuspid. There is mild calcification of the  aortic valve. There is mild thickening of the aortic valve. Aortic valve  regurgitation is not  visualized. Mild aortic valve stenosis.   6. Suspect coarctation of the aorta. Recommend CTA for further  evaluation. Aortic dilatation noted. There is moderate dilatation of the  ascending aorta, measuring 44 mm.   7. The inferior vena cava is dilated in size with <50% respiratory  variability, suggesting right atrial pressure of 15 mmHg.    Cardiac CT: Date: 03/13/21 Results: IMPRESSION: 1. Siever's Zero Bicuspid aortic valve with mild/borderline aneurysmal dilation of the tubular portion of the ascending aorta with a maximal diameter just reaching 4 cm. Recommend annual imaging followup by CTA or MRA. This recommendation follows 2010 ACCF/AHA/AATS/ACR/ASA/SCA/SCAI/SIR/STS/SVM Guidelines for the Diagnosis and Management of Patients with Thoracic Aortic Disease. Circulation. 2010; 121: T654-Y503. Aortic aneurysm NOS (ICD10-I71.9). 2. The aortic root is normal in caliber and there is no effacement of the sino-tubular junction. 3. No evidence of aortic coarctation. 4. 2 vessel aortic arch. The right brachiocephalic and left common carotid artery share a common origin. 5. Hepatic steatosis.   Recent Labs: 03/13/2021: BUN 8; Creatinine, Ser 0.59; Hemoglobin 14.8; Platelets 259; Potassium 3.6; Sodium 140  Recent Lipid Panel    Component Value Date/Time   CHOL 216 (H) 03/13/2021 0055   TRIG 397 (H) 03/13/2021 0055   HDL 47 03/13/2021 0055   CHOLHDL 4.6 03/13/2021 0055   VLDL 79 (H) 03/13/2021 0055   LDLCALC 90 03/13/2021 0055    Physical Exam:    VS:  There were no vitals taken for this visit.    Wt Readings from Last 3 Encounters:  03/13/21 70.4 kg     Gen: *** distress, *** obese/well nourished/malnourished   Neck: No JVD, *** carotid bruit Ears: *** Frank Sign Cardiac: No Rubs or Gallops, *** Murmur, ***cardia, *** radial pulses Respiratory: Clear to auscultation bilaterally, *** effort, ***  respiratory rate GI: Soft, nontender, non-distended *** MS: No ***  edema; *** moves all extremities Integument: Skin feels *** Neuro:  At time of evaluation, alert and oriented to person/place/time/situation *** Psych: Normal affect, patient feels ***   ASSESSMENT:    No diagnosis found. 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  Concerns regarding medicines are outlined above.  No orders of the defined types were placed in this encounter.  No orders of the defined types were placed in this encounter.   There are no Patient Instructions on file for this visit.   Signed, Christell Constant, MD  05/12/2021 12:27 PM    [facility]

## 2021-05-13 ENCOUNTER — Ambulatory Visit: Payer: Self-pay | Admitting: Internal Medicine

## 2021-06-26 ENCOUNTER — Emergency Department (HOSPITAL_COMMUNITY)
Admission: EM | Admit: 2021-06-26 | Discharge: 2021-06-26 | Disposition: A | Discharge status: Home / Self Care | Payer: Self-pay | Attending: Emergency Medicine | Admitting: Emergency Medicine

## 2021-06-26 ENCOUNTER — Other Ambulatory Visit: Payer: Self-pay

## 2021-06-26 ENCOUNTER — Emergency Department (HOSPITAL_COMMUNITY): Payer: Self-pay

## 2021-06-26 ENCOUNTER — Encounter (HOSPITAL_COMMUNITY): Payer: Self-pay | Admitting: Emergency Medicine

## 2021-06-26 DIAGNOSIS — I471 Supraventricular tachycardia: Secondary | ICD-10-CM | POA: Insufficient documentation

## 2021-06-26 DIAGNOSIS — F10920 Alcohol use, unspecified with intoxication, uncomplicated: Secondary | ICD-10-CM | POA: Insufficient documentation

## 2021-06-26 DIAGNOSIS — E876 Hypokalemia: Secondary | ICD-10-CM | POA: Insufficient documentation

## 2021-06-26 LAB — BASIC METABOLIC PANEL
Anion gap: 12 (ref 5–15)
BUN: 6 mg/dL (ref 6–20)
CO2: 22 mmol/L (ref 22–32)
Calcium: 9.2 mg/dL (ref 8.9–10.3)
Chloride: 104 mmol/L (ref 98–111)
Creatinine, Ser: 0.52 mg/dL — ABNORMAL LOW (ref 0.61–1.24)
GFR, Estimated: >60 mL/min (ref >60)
Glucose, Bld: 133 mg/dL — ABNORMAL HIGH (ref 70–99)
Potassium: 3.3 mmol/L — ABNORMAL LOW (ref 3.5–5.1)
Sodium: 138 mmol/L (ref 135–145)

## 2021-06-26 LAB — RAPID URINE DRUG SCREEN, HOSP PERFORMED
Amphetamines: POSITIVE — AB
Barbiturates: NOT DETECTED
Benzodiazepines: NOT DETECTED
Cocaine: POSITIVE — AB
Opiates: NOT DETECTED
Tetrahydrocannabinol: NOT DETECTED

## 2021-06-26 LAB — CBC
HCT: 44.4 % (ref 39.0–52.0)
Hemoglobin: 15.8 g/dL (ref 13.0–17.0)
MCH: 33.3 pg (ref 26.0–34.0)
MCHC: 35.6 g/dL (ref 30.0–36.0)
MCV: 93.5 fL (ref 80.0–100.0)
Platelets: 312 10*3/uL (ref 150–400)
RBC: 4.75 MIL/uL (ref 4.22–5.81)
RDW: 12.3 % (ref 11.5–15.5)
WBC: 13.8 10*3/uL — ABNORMAL HIGH (ref 4.0–10.5)
nRBC: 0 % (ref 0.0–0.2)

## 2021-06-26 LAB — ETHANOL: Alcohol, Ethyl (B): 132 mg/dL — ABNORMAL HIGH (ref <10)

## 2021-06-26 LAB — TROPONIN I (HIGH SENSITIVITY)
Troponin I (High Sensitivity): 8 ng/L (ref <18)
Troponin I (High Sensitivity): 8 ng/L (ref <18)

## 2021-06-26 MED ORDER — POTASSIUM CHLORIDE CRYS ER 20 MEQ PO TBCR
40.0000 meq | EXTENDED_RELEASE_TABLET | Freq: Once | ORAL | Status: AC
  Administered 2021-06-26: 40 meq via ORAL
  Filled 2021-06-26: qty 2

## 2021-06-26 MED ORDER — POTASSIUM CHLORIDE CRYS ER 20 MEQ PO TBCR: 20.0000 meq | EXTENDED_RELEASE_TABLET | Freq: Two times a day (BID) | ORAL | 0 refills | Status: AC

## 2021-06-26 NOTE — ED Triage Notes (Addendum)
Pt c/o chest pain with hx of arrhythmia. Pt's HR 185 in triage. Pt endorses etoh and cocaine use today ?

## 2021-06-26 NOTE — ED Provider Notes (Signed)
?[facility] EMERGENCY DEPARTMENT ?Provider Note ? ? ?CSN: [id number] ?Arrival date & time: 06/26/21  0155 ? ?  ? ?History ? ?Chief Complaint  ?Patient presents with  ? Chest Pain  ? ? ?[name] [name] is a 46 y.o. male. ? ?The history is provided by the patient. A language interpreter was used.  ?Chest Pain ?He has history of SVT, cocaine use, ethanol use comes in because of rapid heartbeat today.  This was associated with chest pain and dyspnea but no nausea or diaphoresis.  Rapid heartbeat resolved while he was in the emergency department before I went in to see him.  He is feeling fine now.  He does admit to both cocaine and ethanol use today. ?  ?Home Medications ?Prior to Admission medications   ?Medication Sig Start Date End Date Taking? Authorizing Provider  ?diltiazem (CARDIZEM CD) 240 MG 24 hr capsule Take 1 capsule (240 mg total) by mouth daily. 03/15/21   Ellsworth Lennox, PA-C  ?Naphazoline HCl (CLEAR EYES OP) Place 1 drop into both eyes daily as needed (For dry eyes).    [provider]  ?   ? ?Allergies    ?Patient has no known allergies.   ? ?Review of Systems   ?Review of Systems  ?Cardiovascular:  Positive for chest pain.  ?All other systems reviewed and are negative. ? ?Physical Exam ?Updated Vital Signs ?BP (!) 127/92   Pulse 93   Temp 98.7 ?F (37.1 ?C) (Oral)   Resp (!) 22   Ht 5\' 4"  (1.626 m)   Wt 70.4 kg   SpO2 94%   BMI 26.64 kg/m?  ?Physical Exam ?Vitals and nursing note reviewed.  ?46 year old male, resting comfortably and in no acute distress. Vital signs are significant for borderline elevated respiratory rate and borderline elevated blood pressure. Oxygen saturation is 94%, which is normal. ?Head is normocephalic and atraumatic. PERRLA, EOMI. Oropharynx is clear. ?Neck is nontender and supple without adenopathy or JVD. ?Back is nontender and there is no CVA tenderness. ?Lungs are clear without rales, wheezes, or rhonchi. ?Chest is  nontender. ?Heart has regular rate and rhythm without murmur. ?Abdomen is soft, flat, nontender. ?Extremities have no cyanosis or edema, full range of motion is present. ?Skin is warm and dry without rash. ?Neurologic: Mental status is normal, cranial nerves are intact, moves all extremities equally. ? ?ED Results / Procedures / Treatments   ?Labs ?(all labs ordered are listed, but only abnormal results are displayed) ?Labs Reviewed  ?BASIC METABOLIC PANEL - Abnormal; Notable for the following components:  ?    Result Value  ? Potassium 3.3 (*)   ? Glucose, Bld 133 (*)   ? Creatinine, Ser 0.52 (*)   ? All other components within normal limits  ?CBC - Abnormal; Notable for the following components:  ? WBC 13.8 (*)   ? All other components within normal limits  ?ETHANOL - Abnormal; Notable for the following components:  ? Alcohol, Ethyl (B) 132 (*)   ? All other components within normal limits  ?RAPID URINE DRUG SCREEN, HOSP PERFORMED  ?TROPONIN I (HIGH SENSITIVITY)  ?TROPONIN I (HIGH SENSITIVITY)  ? ? ?EKG ?EKG Interpretation ? ?Date/Time:  Saturday June 26 2021 01:54:38 EDT ?Ventricular Rate:  184 ?PR Interval:    ?QRS Duration: 96 ?QT Interval:  278 ?QTC Calculation: 486 ?R Axis:   -66 ?Text Interpretation: Supraventricular tachycardia Left axis deviation Incomplete right bundle branch block Left ventricular hypertrophy with repolarization abnormality (  R in aVL , Cornell product , Romhilt-Estes ) Abnormal ECG When compared with ECG of 14-Mar-2021 06:22, Supraventricular tachycardia has replaced Sinus rhythm REPOLARIZATION ABNORMALITY now present - likely secondary to rapid rate Confirmed by Dione Booze ([id number]) on 06/26/2021 3:09:21 AM ? ?Radiology ?DG Chest 2 View ? ?Result Date: 06/26/2021 ?CLINICAL DATA:  Chest pain EXAM: CHEST - 2 VIEW COMPARISON:  03/13/2021 FINDINGS: Heart and mediastinal contours are within normal limits. No focal opacities or effusions. No acute bony abnormality. IMPRESSION: No active  cardiopulmonary disease. Electronically Signed   By: Charlett Nose M.D.   On: 06/26/2021 02:48   ? ?Procedures ?Procedures  ?Cardiac monitor shows normal sinus rhythm, per my interpretation. ? ?Medications Ordered in ED ?Medications  ?potassium chloride SA (KLOR-CON M) CR tablet 40 mEq (40 mEq Oral Given 06/26/21 0355)  ? ? ?ED Course/ Medical Decision Making/ A&P ?  ?                        ?Medical Decision Making ?Amount and/or Complexity of Data Reviewed ?Labs: ordered. ?Radiology: ordered. ? ?Risk ?Prescription drug management. ? ? ?Episode of SVT which has spontaneously resolved.  ECG from triage showed SVT with rate of 184, cardiac monitor now showing sinus rhythm with rate of 92.  Old records reviewed showing hospitalization for SVT on 03/13/2021.  Episode today is likely secondary to cocaine use.  He will be observed in the ED. ? ?Rhythm has been stable since conversion to sinus rhythm.  Labs do show evidence of alcohol intoxication, mild hypokalemia.  He is given a dose of oral potassium.  Troponin is normal x2.  At this point, I do feel he is stable for discharge.  He is encouraged to abstain from alcohol and cocaine because of the potential of either of those to trigger PSVT.  He is referred back to his cardiologist for follow-up. ? ?Final Clinical Impression(s) / ED Diagnoses ?Final diagnoses:  ?PSVT (paroxysmal supraventricular tachycardia) (HCC)  ?Hypokalemia  ?Alcohol intoxication, uncomplicated (HCC)  ? ? ?Rx / DC Orders ?ED Discharge Orders   ? ?      Ordered  ?  potassium chloride SA (KLOR-CON M) 20 MEQ tablet  2 times daily       ? 06/26/21 0557  ? ?  ?  ? ?  ? ? ?  ?Dione Booze, MD ?06/26/21 0600 ? ?

## 2021-06-26 NOTE — Discharge Instructions (Addendum)
No beba alcohol ni consuma coca?na. Esos medicamentos pueden afectar su coraz?n y hacer que tenga un ritmo card?aco r?pido. ? ?Por favor, haga un seguimiento con el cardi?logo. ?

## 2022-09-22 ENCOUNTER — Emergency Department (HOSPITAL_COMMUNITY)
Admission: EM | Admit: 2022-09-22 | Discharge: 2022-09-22 | Discharge status: Against Medical Advice | Payer: Self-pay | Attending: Emergency Medicine | Admitting: Emergency Medicine

## 2022-09-22 ENCOUNTER — Other Ambulatory Visit: Payer: Self-pay

## 2022-09-22 DIAGNOSIS — R519 Headache, unspecified: Secondary | ICD-10-CM | POA: Insufficient documentation

## 2022-09-22 DIAGNOSIS — R059 Cough, unspecified: Secondary | ICD-10-CM | POA: Insufficient documentation

## 2022-09-22 DIAGNOSIS — Z1152 Encounter for screening for COVID-19: Secondary | ICD-10-CM | POA: Insufficient documentation

## 2022-09-22 DIAGNOSIS — Z5321 Procedure and treatment not carried out due to patient leaving prior to being seen by health care provider: Secondary | ICD-10-CM | POA: Insufficient documentation

## 2022-09-22 LAB — SARS CORONAVIRUS 2 BY RT PCR: SARS Coronavirus 2 by RT PCR: NEGATIVE

## 2022-09-22 NOTE — ED Triage Notes (Signed)
Patient reports dry cough and headache x 1 week.

## 2023-08-09 ENCOUNTER — Other Ambulatory Visit: Payer: Self-pay

## 2023-08-09 ENCOUNTER — Encounter (HOSPITAL_COMMUNITY): Payer: Self-pay | Admitting: *Deleted

## 2023-08-09 ENCOUNTER — Emergency Department (HOSPITAL_COMMUNITY)
Admission: EM | Admit: 2023-08-09 | Discharge: 2023-08-09 | Discharge status: Against Medical Advice | Payer: Self-pay | Attending: Emergency Medicine | Admitting: Emergency Medicine

## 2023-08-09 DIAGNOSIS — R519 Headache, unspecified: Secondary | ICD-10-CM | POA: Insufficient documentation

## 2023-08-09 DIAGNOSIS — M7918 Myalgia, other site: Secondary | ICD-10-CM | POA: Insufficient documentation

## 2023-08-09 DIAGNOSIS — Z5321 Procedure and treatment not carried out due to patient leaving prior to being seen by health care provider: Secondary | ICD-10-CM | POA: Insufficient documentation

## 2023-08-09 DIAGNOSIS — R509 Fever, unspecified: Secondary | ICD-10-CM | POA: Insufficient documentation

## 2023-08-09 LAB — RESP PANEL BY RT-PCR (RSV, FLU A&B, COVID)  RVPGX2
Influenza A by PCR: NEGATIVE
Influenza B by PCR: NEGATIVE
Resp Syncytial Virus by PCR: NEGATIVE
SARS Coronavirus 2 by RT PCR: NEGATIVE

## 2023-08-09 NOTE — ED Provider Triage Note (Cosign Needed)
 Emergency Medicine Provider Triage Evaluation Note  [name] [name] , a 48 y.o. male  was evaluated in triage.  Pt complains of fever x 4days.  Review of Systems  Positive: Fever, body aches, headache Negative: Nausea, vomiting, diarrhea, abdominal pain, chest pain, shortness of breath, cough, congestion, sore throat  Physical Exam  BP (!) 133/100 (BP Location: Right Arm)   Pulse 95   Temp 99.1 F (37.3 C)   Resp 18   SpO2 99%  Gen:   Awake, no distress   Resp:  Normal effort  MSK:   Moves extremities without difficulty  Other:    Medical Decision Making  Medically screening exam initiated at 4:54 PM.  Appropriate orders placed.  [name] [name] was informed that the remainder of the evaluation will be completed by another provider, this initial triage assessment does not replace that evaluation, and the importance of remaining in the ED until their evaluation is complete.  Labs ordered   Carie Charity, PA-C 08/09/23 1701

## 2023-08-09 NOTE — ED Notes (Signed)
 Patient called x 3 for VS, no answer.

## 2023-08-09 NOTE — ED Triage Notes (Signed)
 Generalized body aches ? Temp for 4 days

## 2023-09-11 ENCOUNTER — Emergency Department (HOSPITAL_COMMUNITY)
Admission: EM | Admit: 2023-09-11 | Discharge: 2023-09-11 | Disposition: A | Discharge status: Home / Self Care | Payer: Self-pay | Attending: Emergency Medicine | Admitting: Emergency Medicine

## 2023-09-11 ENCOUNTER — Encounter (HOSPITAL_COMMUNITY): Payer: Self-pay

## 2023-09-11 ENCOUNTER — Emergency Department (HOSPITAL_COMMUNITY): Payer: Self-pay

## 2023-09-11 ENCOUNTER — Other Ambulatory Visit: Payer: Self-pay

## 2023-09-11 DIAGNOSIS — F1410 Cocaine abuse, uncomplicated: Secondary | ICD-10-CM | POA: Insufficient documentation

## 2023-09-11 DIAGNOSIS — R079 Chest pain, unspecified: Secondary | ICD-10-CM | POA: Insufficient documentation

## 2023-09-11 LAB — BASIC METABOLIC PANEL WITH GFR
Anion gap: 13 (ref 5–15)
BUN: 5 mg/dL — ABNORMAL LOW (ref 6–20)
CO2: 24 mmol/L (ref 22–32)
Calcium: 9.5 mg/dL (ref 8.9–10.3)
Chloride: 99 mmol/L (ref 98–111)
Creatinine, Ser: 0.52 mg/dL — ABNORMAL LOW (ref 0.61–1.24)
GFR, Estimated: >60 mL/min (ref >60)
Glucose, Bld: 123 mg/dL — ABNORMAL HIGH (ref 70–99)
Potassium: 3.5 mmol/L (ref 3.5–5.1)
Sodium: 136 mmol/L (ref 135–145)

## 2023-09-11 LAB — CBC WITH DIFFERENTIAL/PLATELET
Abs Immature Granulocytes: 0.05 10*3/uL (ref 0.00–0.07)
Basophils Absolute: 0.1 10*3/uL (ref 0.0–0.1)
Basophils Relative: 1 %
Eosinophils Absolute: 0.1 10*3/uL (ref 0.0–0.5)
Eosinophils Relative: 1 %
HCT: 45.3 % (ref 39.0–52.0)
Hemoglobin: 15.9 g/dL (ref 13.0–17.0)
Immature Granulocytes: 0 %
Lymphocytes Relative: 23 %
Lymphs Abs: 2.6 10*3/uL (ref 0.7–4.0)
MCH: 33.2 pg (ref 26.0–34.0)
MCHC: 35.1 g/dL (ref 30.0–36.0)
MCV: 94.6 fL (ref 80.0–100.0)
Monocytes Absolute: 1.5 10*3/uL — ABNORMAL HIGH (ref 0.1–1.0)
Monocytes Relative: 13 %
Neutro Abs: 7 10*3/uL (ref 1.7–7.7)
Neutrophils Relative %: 62 %
Platelets: 271 10*3/uL (ref 150–400)
RBC: 4.79 MIL/uL (ref 4.22–5.81)
RDW: 12.7 % (ref 11.5–15.5)
WBC: 11.4 10*3/uL — ABNORMAL HIGH (ref 4.0–10.5)
nRBC: 0 % (ref 0.0–0.2)

## 2023-09-11 LAB — TROPONIN I (HIGH SENSITIVITY)
Troponin I (High Sensitivity): 8 ng/L (ref <18)
Troponin I (High Sensitivity): 5 ng/L (ref <18)

## 2023-09-11 LAB — BRAIN NATRIURETIC PEPTIDE: B Natriuretic Peptide: 4.8 pg/mL (ref 0.0–100.0)

## 2023-09-11 LAB — RAPID URINE DRUG SCREEN, HOSP PERFORMED
Amphetamines: NOT DETECTED
Barbiturates: NOT DETECTED
Benzodiazepines: NOT DETECTED
Cocaine: POSITIVE — AB
Opiates: NOT DETECTED
Tetrahydrocannabinol: NOT DETECTED

## 2023-09-11 NOTE — ED Notes (Signed)
 Pt lying in stretcher, eyes closed, visible chest rise and fall. No distress noted, patient on cardiac monitor

## 2023-09-11 NOTE — ED Triage Notes (Signed)
 Spanish interpreter used for triage.   Arrives [facility] from home. Says yesterday afternoon he began having left sided chest pains. Admits to cocaine use prior to pain onset.   EMS administer 324mg  ASA, and 2 SL nitroglycerine with (+) improvement in pain.

## 2023-09-11 NOTE — ED Provider Notes (Signed)
  Physical Exam  BP 121/87   Pulse 63   Temp 98.4 F (36.9 C)   Resp 16   Ht 5' 7 (1.702 m)   Wt 70.3 kg   SpO2 98%   BMI 24.28 kg/m   Physical Exam  Procedures  Procedures  ED Course / MDM    Medical Decision Making Amount and/or Complexity of Data Reviewed Labs: ordered. Radiology: ordered.   Received in signout.  Chest pain after cocaine.  Workup reassuring.  Troponin negative x 2.  Discussed with patient.  Will discharge home.       Mozell Arias, MD 09/11/23 [phone]

## 2023-09-11 NOTE — ED Notes (Signed)
 Patient provided with discharge paperwork. Pt demonstrated understanding of material. All questions, comments, and concerns addressed. Pt ambulated in hallway towards exit with no complications

## 2023-09-11 NOTE — ED Provider Notes (Signed)
 [place] EMERGENCY DEPARTMENT AT [facility] Provider Note   CSN: [id number] Arrival date & time: 09/11/23  0430     Patient presents with: Chest Pain   [name] [name] is a 48 y.o. male.   Patient presents to the emergency department for evaluation of chest pain.  Patient reports that pain began this afternoon.  Patient does admit to cocaine abuse, last used earlier today.  No known CAD but does have a history of supraventricular tachycardia.  Patient comes by ambulance having received aspirin  and sublingual nitroglycerin , reports improvement.       Prior to Admission medications   Medication Sig Start Date End Date Taking? Authorizing Provider  aspirin  EC 81 MG tablet Take 243 mg by mouth every 6 (six) hours as needed for mild pain (headache). Swallow whole.    [provider]  diltiazem  (CARDIZEM  CD) 240 MG 24 hr capsule Take 1 capsule (240 mg total) by mouth daily. 03/15/21   Strader, Dimple Francis, PA-C  potassium chloride  SA (KLOR-CON  M) 20 MEQ tablet Take 1 tablet (20 mEq total) by mouth 2 (two) times daily. 06/26/21   Alissa April, MD    Allergies: Patient has no known allergies.    Review of Systems  Updated Vital Signs BP (!) 132/94   Pulse 69   Temp 98.2 F (36.8 C) (Oral)   Resp 15   Ht 5' 7 (1.702 m)   Wt 70.3 kg   SpO2 99%   BMI 24.28 kg/m   Physical Exam Vitals and nursing note reviewed.  Constitutional:      General: He is not in acute distress.    Appearance: He is well-developed.  HENT:     Head: Normocephalic and atraumatic.     Mouth/Throat:     Mouth: Mucous membranes are moist.   Eyes:     General: Vision grossly intact. Gaze aligned appropriately.     Extraocular Movements: Extraocular movements intact.     Conjunctiva/sclera: Conjunctivae normal.    Cardiovascular:     Rate and Rhythm: Normal rate and regular rhythm.     Pulses: Normal pulses.     Heart sounds: Normal heart sounds, S1 normal and S2 normal.  No murmur heard.    No friction rub. No gallop.  Pulmonary:     Effort: Pulmonary effort is normal. No respiratory distress.     Breath sounds: Normal breath sounds.  Abdominal:     Palpations: Abdomen is soft.     Tenderness: There is no abdominal tenderness. There is no guarding or rebound.     Hernia: No hernia is present.   Musculoskeletal:        General: No swelling.     Cervical back: Full passive range of motion without pain, normal range of motion and neck supple. No pain with movement, spinous process tenderness or muscular tenderness. Normal range of motion.     Right lower leg: No edema.     Left lower leg: No edema.   Skin:    General: Skin is warm and dry.     Capillary Refill: Capillary refill takes less than 2 seconds.     Findings: No ecchymosis, erythema, lesion or wound.   Neurological:     Mental Status: He is alert and oriented to person, place, and time.     GCS: GCS eye subscore is 4. GCS verbal subscore is 5. GCS motor subscore is 6.     Cranial Nerves: Cranial nerves 2-12 are intact.  Sensory: Sensation is intact.     Motor: Motor function is intact. No weakness or abnormal muscle tone.     Coordination: Coordination is intact.   Psychiatric:        Mood and Affect: Mood normal.        Speech: Speech normal.        Behavior: Behavior normal.     (all labs ordered are listed, but only abnormal results are displayed) Labs Reviewed  CBC WITH DIFFERENTIAL/PLATELET - Abnormal; Notable for the following components:      Result Value   WBC 11.4 (*)    Monocytes Absolute 1.5 (*)    All other components within normal limits  BASIC METABOLIC PANEL WITH GFR - Abnormal; Notable for the following components:   Glucose, Bld 123 (*)    BUN 5 (*)    Creatinine, Ser 0.52 (*)    All other components within normal limits  RAPID URINE DRUG SCREEN, HOSP PERFORMED - Abnormal; Notable for the following components:   Cocaine POSITIVE (*)    All other components  within normal limits  BRAIN NATRIURETIC PEPTIDE  TROPONIN I (HIGH SENSITIVITY)  TROPONIN I (HIGH SENSITIVITY)    EKG: EKG Interpretation Date/Time:  Monday September 11 2023 04:39:49 EDT Ventricular Rate:  84 PR Interval:  161 QRS Duration:  115 QT Interval:  414 QTC Calculation: 490 R Axis:   -58  Text Interpretation: Sinus rhythm LAD, consider left anterior fascicular block Left ventricular hypertrophy ST elev, probable normal early repol pattern No significant change since last tracing Confirmed by Ballard Bongo [phone]) on 09/11/2023 4:54:56 AM  Radiology: [facility] Query Chest Port 1 View Result Date: 09/11/2023 CLINICAL DATA:  Chest pain EXAM: PORTABLE CHEST 1 VIEW COMPARISON:  06/26/2021 FINDINGS: The lungs are clear without focal pneumonia, edema, pneumothorax or pleural effusion. The cardiopericardial silhouette is within normal limits for size. No acute bony abnormality. Telemetry leads overlie the chest. IMPRESSION: No active disease. Electronically Signed   By: Donnal Fusi M.D.   On: 09/11/2023 05:14     Procedures   Medications Ordered in the ED - No data to display                                  Medical Decision Making Amount and/or Complexity of Data Reviewed Labs: ordered. Radiology: ordered.   Differential Diagnosis considered includes, but not limited to: STEMI; NSTEMI; myocarditis; pericarditis; pulmonary embolism; aortic dissection; pneumothorax; pneumonia; gastritis; musculoskeletal pain  Presents to the emergency department for evaluation of chest pain.  Patient with chest pain for a number of hours before coming to the ED.  He has mild hypertension, otherwise vital signs are unremarkable.  No known CAD.  He does have a history of cocaine abuse.  Patient admits using cocaine earlier in the day.  No ST elevation or ischemia on EKG.  First troponin negative.  Will need second troponin, anticipate discharge if negative.  Signout to oncoming ER physician to follow  troponin.     Final diagnoses:  Chest pain, unspecified type  Cocaine abuse [facility])    ED Discharge Orders     None          Ballard Bongo, MD 09/11/23 [phone]

## 2023-09-11 NOTE — Discharge Instructions (Signed)
 Stop using cocaine.  Follow-up with your doctor as needed
